# Patient Record
Sex: Male | Born: 1960 | Race: White | Hispanic: No | Marital: Single | Smoking: Former smoker
Health system: Southern US, Community
[De-identification: ages and names within clinical notes are randomized; demographics above are authoritative.]

---

## 2019-08-20 ENCOUNTER — Other Ambulatory Visit: Payer: Self-pay

## 2019-08-20 ENCOUNTER — Emergency Department (HOSPITAL_COMMUNITY): Payer: Self-pay

## 2019-08-20 ENCOUNTER — Emergency Department (HOSPITAL_COMMUNITY): Payer: 59

## 2019-08-20 ENCOUNTER — Encounter (HOSPITAL_COMMUNITY): Payer: Self-pay

## 2019-08-20 ENCOUNTER — Observation Stay (HOSPITAL_COMMUNITY)
Admission: EM | Admit: 2019-08-20 | Discharge: 2019-08-22 | Disposition: A | Discharge status: Home / Self Care | Payer: 59 | Attending: Internal Medicine | Admitting: Internal Medicine

## 2019-08-20 DIAGNOSIS — E119 Type 2 diabetes mellitus without complications: Secondary | ICD-10-CM | POA: Insufficient documentation

## 2019-08-20 DIAGNOSIS — Z20822 Contact with and (suspected) exposure to covid-19: Secondary | ICD-10-CM | POA: Insufficient documentation

## 2019-08-20 DIAGNOSIS — R609 Edema, unspecified: Secondary | ICD-10-CM

## 2019-08-20 DIAGNOSIS — L97521 Non-pressure chronic ulcer of other part of left foot limited to breakdown of skin: Secondary | ICD-10-CM | POA: Diagnosis present

## 2019-08-20 DIAGNOSIS — L03116 Cellulitis of left lower limb: Principal | ICD-10-CM | POA: Insufficient documentation

## 2019-08-20 DIAGNOSIS — S92912A Unspecified fracture of left toe(s), initial encounter for closed fracture: Secondary | ICD-10-CM | POA: Diagnosis present

## 2019-08-20 DIAGNOSIS — R600 Localized edema: Secondary | ICD-10-CM | POA: Diagnosis present

## 2019-08-20 DIAGNOSIS — E1100 Type 2 diabetes mellitus with hyperosmolarity without nonketotic hyperglycemic-hyperosmolar coma (NKHHC): Secondary | ICD-10-CM | POA: Diagnosis present

## 2019-08-20 DIAGNOSIS — L03115 Cellulitis of right lower limb: Secondary | ICD-10-CM

## 2019-08-20 LAB — CBC WITH DIFFERENTIAL/PLATELET
Abs Immature Granulocytes: 0.02 10*3/uL (ref 0.00–0.07)
Basophils Absolute: 0.1 10*3/uL (ref 0.0–0.1)
Basophils Relative: 2 %
Eosinophils Absolute: 0.2 10*3/uL (ref 0.0–0.5)
Eosinophils Relative: 3 %
HCT: 40.9 % (ref 39.0–52.0)
Hemoglobin: 13.1 g/dL (ref 13.0–17.0)
Immature Granulocytes: 0 %
Lymphocytes Relative: 20 %
Lymphs Abs: 1.2 10*3/uL (ref 0.7–4.0)
MCH: 26.4 pg (ref 26.0–34.0)
MCHC: 32 g/dL (ref 30.0–36.0)
MCV: 82.5 fL (ref 80.0–100.0)
Monocytes Absolute: 0.5 10*3/uL (ref 0.1–1.0)
Monocytes Relative: 9 %
Neutro Abs: 4 10*3/uL (ref 1.7–7.7)
Neutrophils Relative %: 66 %
Platelets: 255 10*3/uL (ref 150–400)
RBC: 4.96 MIL/uL (ref 4.22–5.81)
RDW: 14.1 % (ref 11.5–15.5)
WBC: 6 10*3/uL (ref 4.0–10.5)
nRBC: 0 % (ref 0.0–0.2)

## 2019-08-20 LAB — HIV ANTIBODY (ROUTINE TESTING W REFLEX): HIV Screen 4th Generation wRfx: NONREACTIVE

## 2019-08-20 LAB — BASIC METABOLIC PANEL
Anion gap: 10 (ref 5–15)
BUN: 20 mg/dL (ref 6–20)
CO2: 23 mmol/L (ref 22–32)
Calcium: 9.1 mg/dL (ref 8.9–10.3)
Chloride: 102 mmol/L (ref 98–111)
Creatinine, Ser: 0.57 mg/dL — ABNORMAL LOW (ref 0.61–1.24)
GFR calc Af Amer: >60 mL/min (ref >60)
GFR calc non Af Amer: >60 mL/min (ref >60)
Glucose, Bld: 227 mg/dL — ABNORMAL HIGH (ref 70–99)
Potassium: 4.3 mmol/L (ref 3.5–5.1)
Sodium: 135 mmol/L (ref 135–145)

## 2019-08-20 LAB — GLUCOSE, CAPILLARY: Glucose-Capillary: 156 mg/dL — ABNORMAL HIGH (ref 70–99)

## 2019-08-20 LAB — SARS CORONAVIRUS 2 BY RT PCR (HOSPITAL ORDER, PERFORMED IN ~~LOC~~ HOSPITAL LAB): SARS Coronavirus 2: NEGATIVE

## 2019-08-20 LAB — LACTIC ACID, PLASMA: Lactic Acid, Venous: 0.7 mmol/L (ref 0.5–1.9)

## 2019-08-20 MED ORDER — VANCOMYCIN HCL 1250 MG/250ML IV SOLN
1250.0000 mg | Freq: Two times a day (BID) | INTRAVENOUS | Status: DC
  Administered 2019-08-20 – 2019-08-22 (×4): 1250 mg via INTRAVENOUS
  Filled 2019-08-20 (×4): qty 250

## 2019-08-20 MED ORDER — TRAMADOL HCL 50 MG PO TABS: 50.0000 mg | ORAL_TABLET | Freq: Four times a day (QID) | ORAL | Status: DC | PRN

## 2019-08-20 MED ORDER — VANCOMYCIN HCL IN DEXTROSE 1-5 GM/200ML-% IV SOLN: 1000.0000 mg | Freq: Once | INTRAVENOUS | Status: DC

## 2019-08-20 MED ORDER — ONDANSETRON HCL 4 MG PO TABS: 4.0000 mg | ORAL_TABLET | Freq: Four times a day (QID) | ORAL | Status: DC | PRN

## 2019-08-20 MED ORDER — IOHEXOL 300 MG/ML  SOLN
100.0000 mL | Freq: Once | INTRAMUSCULAR | Status: AC | PRN
  Administered 2019-08-20: 100 mL via INTRAVENOUS

## 2019-08-20 MED ORDER — ACETAMINOPHEN 325 MG PO TABS: 650.0000 mg | ORAL_TABLET | Freq: Four times a day (QID) | ORAL | Status: DC | PRN

## 2019-08-20 MED ORDER — VANCOMYCIN HCL 2000 MG/400ML IV SOLN
2000.0000 mg | Freq: Once | INTRAVENOUS | Status: AC
  Administered 2019-08-20: 2000 mg via INTRAVENOUS
  Filled 2019-08-20: qty 400

## 2019-08-20 MED ORDER — SENNOSIDES-DOCUSATE SODIUM 8.6-50 MG PO TABS: 1.0000 | ORAL_TABLET | Freq: Every evening | ORAL | Status: DC | PRN

## 2019-08-20 MED ORDER — INSULIN ASPART 100 UNIT/ML ~~LOC~~ SOLN
0.0000 [IU] | Freq: Three times a day (TID) | SUBCUTANEOUS | Status: DC
  Administered 2019-08-20: 2 [IU] via SUBCUTANEOUS
  Administered 2019-08-21: 1 [IU] via SUBCUTANEOUS
  Administered 2019-08-21: 2 [IU] via SUBCUTANEOUS
  Administered 2019-08-22: 3 [IU] via SUBCUTANEOUS
  Administered 2019-08-22: 2 [IU] via SUBCUTANEOUS
  Filled 2019-08-20: qty 0.09

## 2019-08-20 MED ORDER — ENOXAPARIN SODIUM 40 MG/0.4ML ~~LOC~~ SOLN
40.0000 mg | SUBCUTANEOUS | Status: DC
  Administered 2019-08-20 – 2019-08-21 (×2): 40 mg via SUBCUTANEOUS
  Filled 2019-08-20 (×2): qty 0.4

## 2019-08-20 MED ORDER — ACETAMINOPHEN 650 MG RE SUPP: 650.0000 mg | Freq: Four times a day (QID) | RECTAL | Status: DC | PRN

## 2019-08-20 MED ORDER — ONDANSETRON HCL 4 MG/2ML IJ SOLN: 4.0000 mg | Freq: Four times a day (QID) | INTRAMUSCULAR | Status: DC | PRN

## 2019-08-20 MED ORDER — SODIUM CHLORIDE (PF) 0.9 % IJ SOLN
INTRAMUSCULAR | Status: AC
  Filled 2019-08-20: qty 50

## 2019-08-20 MED ORDER — PIPERACILLIN-TAZOBACTAM 3.375 G IVPB
3.3750 g | Freq: Three times a day (TID) | INTRAVENOUS | Status: DC
  Administered 2019-08-20 – 2019-08-22 (×6): 3.375 g via INTRAVENOUS
  Filled 2019-08-20 (×7): qty 50

## 2019-08-20 MED ORDER — PIPERACILLIN-TAZOBACTAM 3.375 G IVPB 30 MIN
3.3750 g | Freq: Once | INTRAVENOUS | Status: AC
  Administered 2019-08-20: 3.375 g via INTRAVENOUS
  Filled 2019-08-20: qty 50

## 2019-08-20 NOTE — H&P (Signed)
History and Physical    Bobby Fernandez IOE:[id number] DOB: [birth year] DOA: 08/20/2019  PCP: Patient, No Pcp Per   Patient coming from: Home  Chief Complaint  Patient presents with   Leg Swelling      HPI: Bobby Fernandez is a 59 y.o. male with medical history significant for diabetes not on medication comes to the ED for evaluation of left leg wound and swelling for a week.  Patient complains of left lower extremity swelling about a week ago with pain behind the left knee, he has only on the left fourth digit after sustaining a cut with some seizure.  He had cleaned with water and hydrogen peroxide.  Patient otherwise denies any nausea, vomiting, chest pain, shortness of breath, fever, chills, headache, focal weakness, numbness tingling, speech difficulties. Patient reports he used ot be on insulin pen last year but due to pandemic and long que in the pharmacy, he is not able to get medication from the state pharmacy, and has not been using anything.  ED Course: Vitals stable afebrile, 7 normal lactic acid normal WBC count.  Duplex was negative for DVT.  Given his significant left lower extremity patient was placed on vancomycin and Zosyn and I is being admitted for further management.  Review of Systems: All systems were reviewed and were negative except as mentioned in HPI above. Negative for fever Negative for chest pain Negative for shortness of breath  History reviewed. No pertinent past medical history.  History reviewed. No pertinent surgical history.   has no history on file for tobacco use, alcohol use, and drug use.  No Known Allergies  No family history on file.   Prior to Admission medications   Medication Sig Start Date End Date Taking? Authorizing Provider  naproxen sodium (ALEVE) 220 MG tablet Take 440 mg by mouth 2 (two) times daily as needed (pain).   Yes [provider]    Physical Exam: Vitals:   08/20/19 0900 08/20/19 0930 08/20/19 1000 08/20/19  1100  BP: (!) 140/81 (!) 141/74 (!) 139/75 (!) 139/83  Pulse: 85 85 86 89  Resp: 14 12 16 15   Temp:      TempSrc:      SpO2: 95% 98% 99% 95%  Weight:      Height:        General exam: AAOx3 , NAD, weak appearing. HEENT:Oral mucosa moist, Ear/Nose WNL grossly, dentition normal. Respiratory system: bilaterally clear,no wheezing or crackles,no use of accessory muscle Cardiovascular system: S1 & S2 +, No JVD,. Gastrointestinal system: Abdomen soft, NT,ND, BS+ Nervous System:Alert, awake, moving extremities and grossly nonfocal Extremities: No edema, distal peripheral pulses palpable.  Skin: No rashes,no icterus. MSK: Normal muscle bulk,tone, power      Labs on Admission: I have personally reviewed following labs and imaging studies  CBC: Recent Labs  Lab 08/20/19 0816  WBC 6.0  NEUTROABS 4.0  HGB 13.1  HCT 40.9  MCV 82.5  PLT 255   Basic Metabolic Panel: Recent Labs  Lab 08/20/19 0816  NA 135  K 4.3  CL 102  CO2 23  GLUCOSE 227*  BUN 20  CREATININE 0.57*  CALCIUM 9.1   GFR: Estimated Creatinine Clearance: 116.9 mL/min (A) (by C-G formula based on SCr of 0.57 mg/dL (L)). Liver Function Tests: No results for input(s): AST, ALT, ALKPHOS, BILITOT, PROT, ALBUMIN in the last 168 hours. No results for input(s): LIPASE, AMYLASE in the last 168 hours. No results for input(s): AMMONIA in the last 168 hours. Coagulation  Profile: No results for input(s): INR, PROTIME in the last 168 hours. Cardiac Enzymes: No results for input(s): CKTOTAL, CKMB, CKMBINDEX, TROPONINI in the last 168 hours. BNP (last 3 results) No results for input(s): PROBNP in the last 8760 hours. HbA1C: No results for input(s): HGBA1C in the last 72 hours. CBG: No results for input(s): GLUCAP in the last 168 hours. Lipid Profile: No results for input(s): CHOL, HDL, LDLCALC, TRIG, CHOLHDL, LDLDIRECT in the last 72 hours. Thyroid Function Tests: No results for input(s): TSH, T4TOTAL, FREET4,  T3FREE, THYROIDAB in the last 72 hours. Anemia Panel: No results for input(s): VITAMINB12, FOLATE, FERRITIN, TIBC, IRON, RETICCTPCT in the last 72 hours. Urine analysis: No results found for: COLORURINE, APPEARANCEUR, LABSPEC, PHURINE, GLUCOSEU, HGBUR, BILIRUBINUR, KETONESUR, PROTEINUR, UROBILINOGEN, NITRITE, LEUKOCYTESUR  Radiological Exams on Admission: DG Foot Complete Left  Result Date: 08/20/2019 CLINICAL DATA:  Acute left foot pain and swelling without known injury. EXAM: LEFT FOOT - COMPLETE 3+ VIEW COMPARISON:  None. FINDINGS: Minimally displaced fracture is seen involving the proximal base of the fifth proximal phalanx which may be acute or subacute. Joint spaces are intact. Mild posterior calcaneal spur for ring is noted. Dorsal soft tissue swelling is noted. IMPRESSION: Minimally displaced fifth proximal phalangeal fracture is noted which may be acute or subacute. Dorsal soft tissue swelling is noted. Electronically Signed   By: Lupita Raider M.D.   On: 08/20/2019 08:08   CT EXTREMITY LOWER LEFT W CONTRAST  Result Date: 08/20/2019 CLINICAL DATA:  Left lower leg pain and swelling for 1 week. Skin wound on the left third and fourth toes. EXAM: CT OF THE LOWER LEFT EXTREMITY WITH CONTRAST TECHNIQUE: Multidetector CT imaging of the lower left extremity was performed according to the standard protocol following intravenous contrast administration. COMPARISON:  Plain films of the left foot earlier today. CONTRAST:  100 mL OMNIPAQUE IOHEXOL 300 MG/ML  SOLN FINDINGS: Bones/Joint/Cartilage No acute or focal bony or joint abnormality is identified. There is some osteophytosis about the knee. Ligaments Suboptimally assessed by CT. Muscles and Tendons No intramuscular fluid collection is seen. No gas within muscle or tracking along fascial planes. There is mild fatty atrophy of all imaged muscle bellies. Soft tissues Reported skin wounds on the third and fourth toes are not visible on this study.  Diffuse subcutaneous edema is present. No soft tissue gas or radiopaque foreign body. No focal fluid collection. IMPRESSION: Diffuse subcutaneous edema consistent with dependent change or cellulitis. The exam is otherwise negative. Electronically Signed   By: Drusilla Kanner M.D.   On: 08/20/2019 10:56   VAS [place] LOWER EXTREMITY VENOUS (DVT) (ONLY [facility] & [facility])  Result Date: 08/20/2019  Lower Venous DVTStudy Indications: Edema.  Comparison Study: no prior Performing Technologist: Blanch Media RVS  Examination Guidelines: A complete evaluation includes B-mode imaging, spectral Doppler, color Doppler, and power Doppler as needed of all accessible portions of each vessel. Bilateral testing is considered an integral part of a complete examination. Limited examinations for reoccurring indications may be performed as noted. The reflux portion of the exam is performed with the patient in reverse Trendelenburg.  +-----+---------------+---------+-----------+----------+--------------+  RIGHT Compressibility Phasicity Spontaneity Properties Thrombus Aging  +-----+---------------+---------+-----------+----------+--------------+  CFV   Full            Yes       Yes                                    +-----+---------------+---------+-----------+----------+--------------+   +---------+---------------+---------+-----------+----------+--------------+  LEFT      Compressibility Phasicity Spontaneity Properties Thrombus Aging  +---------+---------------+---------+-----------+----------+--------------+  CFV       Full            Yes       Yes                                    +---------+---------------+---------+-----------+----------+--------------+  SFJ       Full                                                             +---------+---------------+---------+-----------+----------+--------------+  FV Prox   Full                                                              +---------+---------------+---------+-----------+----------+--------------+  FV Mid    Full                                                             +---------+---------------+---------+-----------+----------+--------------+  FV Distal Full                                                             +---------+---------------+---------+-----------+----------+--------------+  PFV       Full                                                             +---------+---------------+---------+-----------+----------+--------------+  POP       Full            Yes       Yes                                    +---------+---------------+---------+-----------+----------+--------------+  PTV       Full                                                             +---------+---------------+---------+-----------+----------+--------------+  PERO  Not visualized  +---------+---------------+---------+-----------+----------+--------------+     Summary: RIGHT: - No evidence of common femoral vein obstruction.  LEFT: - There is no evidence of deep vein thrombosis in the lower extremity.  - No cystic structure found in the popliteal fossa.  *See table(s) above for measurements and observations.    Preliminary      Assessment/Plan  Left leg cellulitis with wound on the left fourth digit.  Patient is admitted continue to bolus on antibiotics and hopefully can de-escalate antibiotics soon.  No DVT on the duplex.  CT lower extremity with contrast shows diffuse subcutaneous edema consistent with dependent changes or cellulitis but no other acute finding otherwise.  Continue pain control, elevate the leg, PT eval.  Diabetes mellitus not on medication apparently was supposed to be insulin but could not afford.  We will keep on sliding scale insulin check hemoglobin A1c.  Based on A1c can consider resuming insulin vs OHA  Obesity with BMI 29.9 he will benefit with weight  loss.  Body mass index is 29.99 kg/m.   Severity of Illness: * I certify that at the point of admission it is my clinical judgment that the patient will require inpatient hospital care spanning beyond 2 midnights from the point of admission due to high intensity of service, high risk for further deterioration and high frequency of surveillance required due to patient's LLE cellulitis, hyperglycemia.   DVT prophylaxis: enoxaparin (LOVENOX) injection 40 mg Start: 08/20/19 1145 Code Status:   Code Status: Full Code Family Communication: Admission, patients condition and plan of care including tests being ordered have been discussed with the patient who indicate understanding and agree with the plan and Code Status.  Consults called:   Lanae Boast MD [facility]  If 7PM-7AM, please contact night-coverage [email or web address]  08/20/2019, 11:36 AM

## 2019-08-20 NOTE — ED Provider Notes (Signed)
[facility]-EMERGENCY DEPT Provider Note   CSN: [id number] Arrival date & time: 08/20/19  0216     History Chief Complaint  Patient presents with  . Leg Swelling    Bobby OaksRobert Fernandez is a 59 y.o. male.  HPI   59 year old male presenting for evaluation of left lower extremity swelling that started about a week ago.  He also complains of pain behind the left knee.  He denies any chest pain or shortness of breath.  He does endorse having a wound to the left fourth digit.  States that he had some swelling to the toe about a week ago so he cut it with some scissors.  Following that he cleansed it with some water and hydrogen peroxide.  He denies having any fevers.  Denies hemoptysis, recent surgery, recent long travel, hormone use, personal hx of cancer, or hx of DVT/PE.    History reviewed. No pertinent past medical history.  Patient Active Problem List   Diagnosis Date Noted  . Left leg cellulitis 08/20/2019    History reviewed. No pertinent surgical history.     No family history on file.  Social History   Tobacco Use  . Smoking status: Not on file  Substance Use Topics  . Alcohol use: Not on file  . Drug use: Not on file    Home Medications Prior to Admission medications   Medication Sig Start Date End Date Taking? Authorizing Provider  naproxen sodium (ALEVE) 220 MG tablet Take 440 mg by mouth 2 (two) times daily as needed (pain).   Yes [provider]    Allergies    Patient has no known allergies.  Review of Systems   Review of Systems  Constitutional: Negative for fever.  HENT: Negative for ear pain and sore throat.   Eyes: Negative for visual disturbance.  Respiratory: Negative for cough and shortness of breath.   Cardiovascular: Positive for leg swelling. Negative for chest pain.  Gastrointestinal: Negative for abdominal pain, constipation, diarrhea, nausea and vomiting.  Genitourinary: Negative for dysuria and hematuria.    Musculoskeletal:       Left leg pain  Skin: Positive for wound.  Neurological: Negative for headaches.  All other systems reviewed and are negative.   Physical Exam Updated Vital Signs BP (!) 139/83   Pulse 89   Temp 97.6 F (36.4 C) (Oral)   Resp 15   Ht 5\' 11"  (1.803 m)   Wt (!) 97.5 kg   SpO2 95%   BMI 29.99 kg/m   Physical Exam Vitals and nursing note reviewed.  Constitutional:      Appearance: He is well-developed.  HENT:     Head: Normocephalic and atraumatic.  Eyes:     Conjunctiva/sclera: Conjunctivae normal.  Cardiovascular:     Rate and Rhythm: Normal rate and regular rhythm.     Pulses: Normal pulses.     Heart sounds: Normal heart sounds. No murmur heard.   Pulmonary:     Effort: Pulmonary effort is normal. No respiratory distress.     Breath sounds: Normal breath sounds. No wheezing, rhonchi or rales.  Abdominal:     General: Bowel sounds are normal.     Palpations: Abdomen is soft.     Tenderness: There is no abdominal tenderness. There is no guarding or rebound.  Musculoskeletal:     Cervical back: Neck supple.     Comments: 3+ pitting edema to the LLE with TTP to the posterior knee. Left 4th toe is open  wound/ulceration w/u active drainage from the wound. Erythema and warmth noted to the foot and extending up to the thigh area.  Skin:    General: Skin is warm and dry.  Neurological:     Mental Status: He is alert.       ED Results / Procedures / Treatments   Labs (all labs ordered are listed, but only abnormal results are displayed) Labs Reviewed  BASIC METABOLIC PANEL - Abnormal; Notable for the following components:      Result Value   Glucose, Bld 227 (*)    Creatinine, Ser 0.57 (*)    All other components within normal limits  CULTURE, BLOOD (ROUTINE X 2)  CULTURE, BLOOD (ROUTINE X 2)  SARS CORONAVIRUS 2 BY RT PCR (HOSPITAL ORDER, PERFORMED IN [facility])  CBC WITH DIFFERENTIAL/PLATELET  LACTIC ACID, PLASMA  LACTIC  ACID, PLASMA    EKG None  Radiology DG Foot Complete Left  Result Date: 08/20/2019 CLINICAL DATA:  Acute left foot pain and swelling without known injury. EXAM: LEFT FOOT - COMPLETE 3+ VIEW COMPARISON:  None. FINDINGS: Minimally displaced fracture is seen involving the proximal base of the fifth proximal phalanx which may be acute or subacute. Joint spaces are intact. Mild posterior calcaneal spur for ring is noted. Dorsal soft tissue swelling is noted. IMPRESSION: Minimally displaced fifth proximal phalangeal fracture is noted which may be acute or subacute. Dorsal soft tissue swelling is noted. Electronically Signed   By: Lupita Raider M.D.   On: 08/20/2019 08:08   CT EXTREMITY LOWER LEFT W CONTRAST  Result Date: 08/20/2019 CLINICAL DATA:  Left lower leg pain and swelling for 1 week. Skin wound on the left third and fourth toes. EXAM: CT OF THE LOWER LEFT EXTREMITY WITH CONTRAST TECHNIQUE: Multidetector CT imaging of the lower left extremity was performed according to the standard protocol following intravenous contrast administration. COMPARISON:  Plain films of the left foot earlier today. CONTRAST:  100 mL OMNIPAQUE IOHEXOL 300 MG/ML  SOLN FINDINGS: Bones/Joint/Cartilage No acute or focal bony or joint abnormality is identified. There is some osteophytosis about the knee. Ligaments Suboptimally assessed by CT. Muscles and Tendons No intramuscular fluid collection is seen. No gas within muscle or tracking along fascial planes. There is mild fatty atrophy of all imaged muscle bellies. Soft tissues Reported skin wounds on the third and fourth toes are not visible on this study. Diffuse subcutaneous edema is present. No soft tissue gas or radiopaque foreign body. No focal fluid collection. IMPRESSION: Diffuse subcutaneous edema consistent with dependent change or cellulitis. The exam is otherwise negative. Electronically Signed   By: Drusilla Kanner M.D.   On: 08/20/2019 10:56   VAS [place] LOWER  EXTREMITY VENOUS (DVT) (ONLY [facility] & [facility])  Result Date: 08/20/2019  Lower Venous DVTStudy Indications: Edema.  Comparison Study: no prior Performing Technologist: Blanch Media RVS  Examination Guidelines: A complete evaluation includes B-mode imaging, spectral Doppler, color Doppler, and power Doppler as needed of all accessible portions of each vessel. Bilateral testing is considered an integral part of a complete examination. Limited examinations for reoccurring indications may be performed as noted. The reflux portion of the exam is performed with the patient in reverse Trendelenburg.  +-----+---------------+---------+-----------+----------+--------------+ RIGHTCompressibilityPhasicitySpontaneityPropertiesThrombus Aging +-----+---------------+---------+-----------+----------+--------------+ CFV  Full           Yes      Yes                                 +-----+---------------+---------+-----------+----------+--------------+   +---------+---------------+---------+-----------+----------+--------------+  LEFT     CompressibilityPhasicitySpontaneityPropertiesThrombus Aging +---------+---------------+---------+-----------+----------+--------------+ CFV      Full           Yes      Yes                                 +---------+---------------+---------+-----------+----------+--------------+ SFJ      Full                                                        +---------+---------------+---------+-----------+----------+--------------+ FV Prox  Full                                                        +---------+---------------+---------+-----------+----------+--------------+ FV Mid   Full                                                        +---------+---------------+---------+-----------+----------+--------------+ FV DistalFull                                                        +---------+---------------+---------+-----------+----------+--------------+ PFV       Full                                                        +---------+---------------+---------+-----------+----------+--------------+ POP      Full           Yes      Yes                                 +---------+---------------+---------+-----------+----------+--------------+ PTV      Full                                                        +---------+---------------+---------+-----------+----------+--------------+ PERO                                                  Not visualized +---------+---------------+---------+-----------+----------+--------------+     Summary: RIGHT: - No evidence of common femoral vein obstruction.  LEFT: - There is no evidence of deep vein thrombosis in the lower extremity.  - No cystic structure found in the popliteal fossa.  *See table(s) above for measurements and observations.    Preliminary     Procedures  Procedures (including critical care time)  Medications Ordered in ED Medications  vancomycin (VANCOREADY) IVPB 2000 mg/400 mL (2,000 mg Intravenous New Bag/Given 08/20/19 1111)  piperacillin-tazobactam (ZOSYN) IVPB 3.375 g ( Intravenous Stopped 08/20/19 1108)  sodium chloride (PF) 0.9 % injection (  Given by Other 08/20/19 1041)  iohexol (OMNIPAQUE) 300 MG/ML solution 100 mL (100 mLs Intravenous Contrast Given 08/20/19 1026)    ED Course  I have reviewed the triage vital signs and the nursing notes.  Pertinent labs & imaging results that were available during my care of the patient were reviewed by me and considered in my medical decision making (see chart for details).    MDM Rules/Calculators/A&P                          59 year old male presenting for evaluation of left lower extremity swelling.  Also has wound to right fourth toe  CBC without evidence of leukocytosis or anemia BMP with elevated blood glucose but otherwise reassuring Lactic acid negative Blood cultures obtained  X-ray of the left foot does not show  any evidence of osteomyelitis but does show likely subacute fracture of the left fifth proximal phalanges  Right lower extremity ultrasound w/o evidence of DVT  CT RLE with diffuse subcutaneous edema consistent with dependent change or cellulitis. The exam is otherwise negative  Pt with rapidly progressing RLE cellulitis. No obvious osteomyelitis on imaging thus far. Will admit for further tx.   11:31 AM CONSULT with Dr. Lanae Boast who accepts patient for admission.  Final Clinical Impression(s) / ED Diagnoses Final diagnoses:  Cellulitis of right lower extremity    Rx / DC Orders ED Discharge Orders    None       Karrie Meres, PA-C 08/20/19 1137    Sabas Sous, MD 08/20/19 1547

## 2019-08-20 NOTE — Progress Notes (Signed)
A consult was received from an ED physician for Vancomycin per pharmacy dosing.  The patient's profile has been reviewed for ht/wt/allergies/indication/available labs.    A one time order has been placed for Vancomycin 2gm, discontinued the 1gm ordered by ED extender.  Further antibiotics/pharmacy consults should be ordered by admitting physician if indicated.                       Thank you,  Otho Bellows PharmD 08/20/2019  9:33 AM

## 2019-08-20 NOTE — Progress Notes (Signed)
Lower extremity venous has been completed.   Preliminary results in CV Proc.   Blanch Media 08/20/2019 8:37 AM

## 2019-08-20 NOTE — ED Triage Notes (Signed)
Pt sts left leg swelling x 1 week. Denies any  medical history.

## 2019-08-20 NOTE — Progress Notes (Signed)
Pharmacy Antibiotic Note  Bobby Fernandez is a 59 y.o. male admitted on 08/20/2019 with cellulitis.  Pharmacy has been consulted for Vancomycin & Zosyn dosing.  Plan: Zosyn 3.375g IV q8h (4 hour infusion).  Vancomycin 2gm x1, then 1250mg  q12  Height: 5\' 11"  (180.3 cm) Weight: (!) 97.5 kg (215 lb) IBW/kg (Calculated) : 75.3  Temp (24hrs), Avg:97.8 F (36.6 C), Min:97.6 F (36.4 C), Max:97.9 F (36.6 C)  Recent Labs  Lab 08/20/19 0816 08/20/19 0929  WBC 6.0  --   CREATININE 0.57*  --   LATICACIDVEN  --  0.7    Estimated Creatinine Clearance: 116.9 mL/min (A) (by C-G formula based on SCr of 0.57 mg/dL (L)).    No Known Allergies  Antimicrobials this admission: 7/30 Zosyn >>  7/30 Vancomycin >>   Dose adjustments this admission:  Microbiology results: 7/30 BCx: sent  Thank you for allowing pharmacy to be a part of this patient's care.  8/30 PharmD 08/20/2019 11:53 AM

## 2019-08-21 DIAGNOSIS — L03116 Cellulitis of left lower limb: Secondary | ICD-10-CM

## 2019-08-21 DIAGNOSIS — E1165 Type 2 diabetes mellitus with hyperglycemia: Secondary | ICD-10-CM | POA: Diagnosis present

## 2019-08-21 DIAGNOSIS — L97521 Non-pressure chronic ulcer of other part of left foot limited to breakdown of skin: Secondary | ICD-10-CM | POA: Diagnosis present

## 2019-08-21 DIAGNOSIS — E119 Type 2 diabetes mellitus without complications: Secondary | ICD-10-CM | POA: Diagnosis present

## 2019-08-21 DIAGNOSIS — E1100 Type 2 diabetes mellitus with hyperosmolarity without nonketotic hyperglycemic-hyperosmolar coma (NKHHC): Secondary | ICD-10-CM | POA: Diagnosis present

## 2019-08-21 DIAGNOSIS — S92912A Unspecified fracture of left toe(s), initial encounter for closed fracture: Secondary | ICD-10-CM | POA: Diagnosis present

## 2019-08-21 LAB — COMPREHENSIVE METABOLIC PANEL
ALT: 14 U/L (ref 0–44)
AST: 13 U/L — ABNORMAL LOW (ref 15–41)
Albumin: 3.4 g/dL — ABNORMAL LOW (ref 3.5–5.0)
Alkaline Phosphatase: 96 U/L (ref 38–126)
Anion gap: 8 (ref 5–15)
BUN: 13 mg/dL (ref 6–20)
CO2: 22 mmol/L (ref 22–32)
Calcium: 8.5 mg/dL — ABNORMAL LOW (ref 8.9–10.3)
Chloride: 106 mmol/L (ref 98–111)
Creatinine, Ser: 0.48 mg/dL — ABNORMAL LOW (ref 0.61–1.24)
GFR calc Af Amer: >60 mL/min (ref >60)
GFR calc non Af Amer: >60 mL/min (ref >60)
Glucose, Bld: 152 mg/dL — ABNORMAL HIGH (ref 70–99)
Potassium: 4 mmol/L (ref 3.5–5.1)
Sodium: 136 mmol/L (ref 135–145)
Total Bilirubin: 1 mg/dL (ref 0.3–1.2)
Total Protein: 6.3 g/dL — ABNORMAL LOW (ref 6.5–8.1)

## 2019-08-21 LAB — GLUCOSE, CAPILLARY
Glucose-Capillary: 194 mg/dL — ABNORMAL HIGH (ref 70–99)
Glucose-Capillary: 145 mg/dL — ABNORMAL HIGH (ref 70–99)
Glucose-Capillary: 184 mg/dL — ABNORMAL HIGH (ref 70–99)

## 2019-08-21 LAB — CBC
HCT: 35.5 % — ABNORMAL LOW (ref 39.0–52.0)
Hemoglobin: 11.5 g/dL — ABNORMAL LOW (ref 13.0–17.0)
MCH: 26.7 pg (ref 26.0–34.0)
MCHC: 32.4 g/dL (ref 30.0–36.0)
MCV: 82.6 fL (ref 80.0–100.0)
Platelets: 223 10*3/uL (ref 150–400)
RBC: 4.3 MIL/uL (ref 4.22–5.81)
RDW: 14 % (ref 11.5–15.5)
WBC: 4.3 10*3/uL (ref 4.0–10.5)
nRBC: 0 % (ref 0.0–0.2)

## 2019-08-21 LAB — HEMOGLOBIN A1C
Hgb A1c MFr Bld: 9.9 % — ABNORMAL HIGH (ref 4.8–5.6)
Mean Plasma Glucose: 237.43 mg/dL

## 2019-08-21 MED ORDER — MUPIROCIN 2 % EX OINT
TOPICAL_OINTMENT | Freq: Every day | CUTANEOUS | Status: DC
  Filled 2019-08-21: qty 22

## 2019-08-21 NOTE — Progress Notes (Signed)
Physical Therapy Treatment Patient Details Name: Bobby Fernandez MRN: [id number] DOB: 13-Jul-1960 Today's Date: 08/21/2019    History of Present Illness 59 year old male presenting for evaluation of left lower extremity swelling ,pain behind the left kneehas  a wound to the left fourth digit.    PT Comments    Patient instructed in use 56fcane for ambulation of which is beneficial for balance and gaitto offset some weight bearing  From left foot. Patient has post op shoe but not wearing it at present. Patient is ambulatory in room. No further PT needs at this time. PT will sign off.   RECOMMEND SINGLE POINT CANE   PRIOR TO DC.  Follow Up Recommendations  No PT follow up     Equipment Recommendations  Cane    Recommendations for Other Services       Precautions / Restrictions Precautions Precautions: None Precaution Comments: foot ulcer- has a post op shoe but not wearing it Required Braces or Orthoses: Other Brace Other Brace: post op shoe Restrictions Weight Bearing Restrictions: No    Mobility  Bed Mobility Overal bed mobility: In recliner                Transfers Overall transfer level: Independent                  Ambulation/Gait Ambulation/Gait assistance: Modified independent (Device/Increase time) Gait Distance (Feet): 200 Feet Assistive device: Straight cane Gait Pattern/deviations: Step-through pattern   Gait velocity interpretation: <1.31 ft/sec, indicative of household ambulator General Gait Details: ambulated with SPC. gait steady   Stairs             Wheelchair Mobility    Modified Rankin (Stroke Patients Only)       Balance Overall balance assessment: No apparent balance deficits (not formally assessed)                                          Cognition                                              Exercises      General Comments        Pertinent Vitals/Pain Pain Location:  when weight bearing left Pain Descriptors / Indicators: Discomfort;Grimacing;Pressure Pain Intervention(s): Monitored during session    Home Living                      Prior Function            PT Goals (current goals can now be found in the care plan section) Progress towards PT goals: Goals met/education completed, patient discharged from PT    Frequency           PT Plan Current plan remains appropriate    Co-evaluation              AM-PAC PT "6 Clicks" Mobility   Outcome Measure  Help needed turning from your back to your side while in a flat bed without using bedrails?: None Help needed moving from lying on your back to sitting on the side of a flat bed without using bedrails?: None Help needed moving to and from a bed to a chair (including a wheelchair)?: None Help needed standing up from  a chair using your arms (e.g., wheelchair or bedside chair)?: None Help needed to walk in hospital room?: None Help needed climbing 3-5 steps with a railing? : A Little 6 Click Score: 23    End of Session   Activity Tolerance: Patient tolerated treatment well Patient left: in chair;with call bell/phone within reach Nurse Communication: Mobility status PT Visit Diagnosis: Unsteadiness on feet (R26.81);Pain Pain - Right/Left: Left Pain - part of body: Ankle and joints of foot     Time: 6701-4103 PT Time Calculation (min) (ACUTE ONLY): 11 min  Charges:  $Gait Training: 8-22 mins                     [place] Pager [phone] Office [phone]    Claretha Cooper 08/21/2019, 3:23 PM

## 2019-08-21 NOTE — Progress Notes (Signed)
Orthopedic Tech Progress Note Patient Details:  Bobby Fernandez 03/21/1960 [id number]  Ortho Devices Type of Ortho Device: Postop shoe/boot Ortho Device/Splint Location: LLE Ortho Device/Splint Interventions: Ordered, Application   Post Interventions Patient Tolerated: Well Instructions Provided: Care of device   Jennye Moccasin 08/21/2019, 12:23 PM

## 2019-08-21 NOTE — Discharge Instructions (Signed)
You may put all of your weight on your left foot as comfort allows in the postop shoe. You may soak your left foot once daily in room temperature water combined with antibacterial soap.  Make the water soapy.  Do this for 15 minutes. After you soak your foot, dried off for a well and place a small amount of Bactroban ointment on the fourth toe wound once a day followed by a Band-Aid.

## 2019-08-21 NOTE — Progress Notes (Signed)
PROGRESS NOTE  Bobby Fernandez ATF:[id number] DOB: July 20, 1960 DOA: 08/20/2019 PCP: Patient, No Pcp Per  Brief History   Bobby Fernandez is a 59 y.o. male with medical history significant for diabetes not on medication comes to the ED for evaluation of left leg wound and swelling for a week.  Patient complains of left lower extremity swelling about a week ago with pain behind the left knee, he has only on the left fourth digit after sustaining a cut with some seizure.  He had cleaned with water and hydrogen peroxide.  Patient otherwise denies any nausea, vomiting, chest pain, shortness of breath, fever, chills, headache, focal weakness, numbness tingling, speech difficulties. Patient reports he used ot be on insulin pen last year but due to pandemic and long que in the pharmacy, he is not able to get medication from the state pharmacy, and has not been using anything.  The patient has been admitted to a medical bed. He has been started on Zosyn and vancomycin for the ulcerations on the 4th and 5th digits of the left foot and cellulitis of the left lower extremity. He states that a lot of the swelling in the extremity has gone away. X-ray of the left foot demonstrated fracture of the proximal phalanx of the 5th digit of the right foot. They have recommended a post-op bot for the left lower extremity. They have also recommended washing the foot daily in antibacterial soapy water for 15 minutes, drying it and them applying bactroban ointment over the 4th toe daily. They will follow him up as outpatient in 1-2 weeks. Blood cultures x 2 have been obtained and have had no growth.  The patient has been evaluated by PT. They have recommended a single point cane prior to discharge. DVT in the left lower extremity has been ruled out by doppler.  Consultants  . Orthopedic surgery.  Procedures  . None  Antibiotics   Anti-infectives (From admission, onward)   Start     Dose/Rate Route Frequency Ordered Stop    08/20/19 2300  vancomycin (VANCOREADY) IVPB 1250 mg/250 mL     Discontinue     1,250 mg 166.7 mL/hr over 90 Minutes Intravenous Every 12 hours 08/20/19 1303     08/20/19 1800  piperacillin-tazobactam (ZOSYN) IVPB 3.375 g     Discontinue     3.375 g 12.5 mL/hr over 240 Minutes Intravenous Every 8 hours 08/20/19 1303     08/20/19 1000  vancomycin (VANCOREADY) IVPB 2000 mg/400 mL        2,000 mg 200 mL/hr over 120 Minutes Intravenous  Once 08/20/19 0932 08/20/19 1329   08/20/19 0930  vancomycin (VANCOCIN) IVPB 1000 mg/200 mL premix  Status:  Discontinued        1,000 mg 200 mL/hr over 60 Minutes Intravenous  Once 08/20/19 0929 08/20/19 0932   08/20/19 0930  piperacillin-tazobactam (ZOSYN) IVPB 3.375 g        3.375 g 100 mL/hr over 30 Minutes Intravenous  Once 08/20/19 0929 08/20/19 1108    .   Subjective  The patient is resting comfortably. No new complaints.  Objective   Vitals:  Vitals:   08/21/19 0621 08/21/19 1317  BP: (!) 148/84 (!) 138/80  Pulse: 83 85  Resp: 17 16  Temp: 97.7 F (36.5 C) 98.1 F (36.7 C)  SpO2: 96% 97%   Exam:  Constitutional:  . The patient is awake, alert, and oriented x 3. No acute distress. Respiratory:  . No increased work of breathing. Marland Kitchen No  wheezes, rales, or rhonchi . No tactile fremitus Cardiovascular:  . Regular rate and rhythm . No murmurs, ectopy, or gallups. . No lateral PMI. No thrills. Abdomen:  . Abdomen is soft, non-tender, non-distended . No hernias, masses, or organomegaly . Normoactive bowel sounds.  Musculoskeletal:  . No cyanosis, clubbing, or edema . Left lower extremity is swollen.  . Left foot is erythematous and swollen. The 4th digit is ulcerated, and the fifth digit is disfigured. Skin:  . No rashes or lesions . There are ulcers to the 4th and 5th digits of the left foot. . There is also erythema and swelling to the left lower extremity and foot. . palpation of skin: no induration or nodules Neurologic:  . CN  2-12 intact . Sensation all 4 extremities intact Psychiatric:  . Mental status o Mood, affect appropriate o Orientation to person, place, time  . judgment and insight appear intact  I have personally reviewed the following:   Today's Data  . Vitals, CMP, CBC  Micro Data  . Blood cultures x 2 have had no growth.  Imaging  . X-ray of left foot.  Scheduled Meds: . enoxaparin (LOVENOX) injection  40 mg Subcutaneous Q24H  . insulin aspart  0-9 Units Subcutaneous TID WC  . mupirocin ointment   Topical Daily   Continuous Infusions: . piperacillin-tazobactam (ZOSYN)  IV 3.375 g (08/21/19 1053)  . 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 This is now complicated by cellulitis. He is receiving IV Vancomycin and cefepime. Blood cultures x 2 have been obtained and have had no growth. DVT in the extremity has been ruled out by doppler.  Ulceration of 4th digit of the right foot: The patient has been instructed not to "bust" blisters. Orthopedic surgery has recommended that the patient soak the foot in room temp bacterial soapy water for 15 minutes, dry the foot and then apply a small amount of bactroban ointment over the top of the 4rth digit daily. Dr. Magnus Ivan will see the patient in follow up in 1-2 weeks.  Fracture of base of proximal phalanx of 5th digit: I greatly appreciate the assistance of Dr. Magnus Ivan. He has arranged for the patient to get a post-op boot and will follow the patient up as outpatient in 1-2 weeks.  DM II: Uncontrolled. HbA1c is 9.9. The patient states that he has not had insulin in about a year and half  due to lack of insurance. I will consult SW to see if we can help him with this prior to discharge.  It is critical to the prevention of further infection and possible loss of his feet.  I have seen and examined this patient myself. I have spent 34 minutes in his evaluation and care.  DVT prophylaxis: Lovenox Code Status: Full Code Family Communication: None available Disposition Plan:   Status is: Inpatient  Remains inpatient appropriate because:Inpatient level of care appropriate due to severity of illness   Dispo: The patient is from: Home              Anticipated d/c is to: Home  Anticipated d/c date is: 1 day              Patient currently is not medically stable to d/c.  Bebe Moncure, DO [facility] Direct contact: see [email or web address]  7PM-7AM contact night coverage as above 08/21/2019, 6:52

## 2019-08-21 NOTE — Consult Note (Signed)
Reason for Consult:  Left foot 4th toe wound and fifth toe proximal phalanx fracture Referring Physician:  [facility]  Bobby Fernandez is an 59 y.o. male.  HPI:   The patient is a poorly controlled diabetic 59 year old gentleman who was admitted to the hospitalist service yesterday in the emergency room after presenting to the emergency room with left leg swelling and cellulitis.  He is reports that he had developed a laceration over the dorsal aspect of his left fourth toe recently and was treating it with peroxide.  X-rays yesterday of the left foot show a nondisplaced fracture of the fifth toe proximal phalanx at the base.  I reviewed these x-rays.  There is no evidence of bony destruction consistent with osteomyelitis and it actually appears subacute.  Orthopedic surgery was consulted for recommendations as to treatment for his foot.  He is on vancomycin IV for his cellulitis.  His hemoglobin A1c was well above 9 yesterday.  He does report some numbness in his feet and has never had any foot surgery before or significant wounds on his feet.  History reviewed. No pertinent past medical history.  History reviewed. No pertinent surgical history.  No family history on file.  Social History:  reports that he has quit smoking. He has never used smokeless tobacco. No history on file for alcohol use and drug use.  Allergies: No Known Allergies  Medications: I have reviewed the patient's current medications.  Results for orders placed or performed during the hospital encounter of 08/20/19 (from the past 48 hour(s))  CBC with Differential     Status: None   Collection Time: 08/20/19  8:16 AM  Result Value Ref Range   WBC 6.0 4.0 - 10.5 K/uL   RBC 4.96 4.22 - 5.81 MIL/uL   Hemoglobin 13.1 13.0 - 17.0 g/dL   HCT 41.7 39 - 52 %   MCV 82.5 80.0 - 100.0 fL   MCH 26.4 26.0 - 34.0 pg   MCHC 32.0 30.0 - 36.0 g/dL   RDW 40.8 14.4 - 81.8 %   Platelets 255 150 - 400 K/uL   nRBC 0.0 0.0 - 0.2 %     Neutrophils Relative % 66 %   Neutro Abs 4.0 1.7 - 7.7 K/uL   Lymphocytes Relative 20 %   Lymphs Abs 1.2 0.7 - 4.0 K/uL   Monocytes Relative 9 %   Monocytes Absolute 0.5 0 - 1 K/uL   Eosinophils Relative 3 %   Eosinophils Absolute 0.2 0 - 0 K/uL   Basophils Relative 2 %   Basophils Absolute 0.1 0 - 0 K/uL   Immature Granulocytes 0 %   Abs Immature Granulocytes 0.02 0.00 - 0.07 K/uL    Comment: Performed at [facility], [address] [place], [place] [address]  Basic metabolic panel     Status: Abnormal   Collection Time: 08/20/19  8:16 AM  Result Value Ref Range   Sodium 135 135 - 145 mmol/L   Potassium 4.3 3.5 - 5.1 mmol/L   Chloride 102 98 - 111 mmol/L   CO2 23 22 - 32 mmol/L   Glucose, Bld 227 (H) 70 - 99 mg/dL    Comment: Glucose reference range applies only to samples taken after fasting for at least 8 hours.   BUN 20 6 - 20 mg/dL   Creatinine, Ser 9.70 (L) 0.61 - 1.24 mg/dL   Calcium 9.1 8.9 - 26.3 mg/dL   GFR calc non Af Amer >60 >60 mL/min  GFR calc Af Amer >60 >60 mL/min   Anion gap 10 5 - 15    Comment: Performed at [facility], [address], [place], [place] [address]  Blood culture (routine x 2)     Status: None (Preliminary result)   Collection Time: 08/20/19  9:28 AM   Specimen: BLOOD  Result Value Ref Range   Specimen Description      BLOOD LEFT WRIST Performed at [facility], [address], [place], [place] [address]    Special Requests      BOTTLES DRAWN AEROBIC AND ANAEROBIC Blood Culture adequate volume Performed at [facility], [address] [place], [place] [address]    Culture      NO GROWTH < 24 HOURS Performed at [facility], [address], [place], [place] [address]    Report Status PENDING   Lactic acid, plasma     Status: None   Collection Time: 08/20/19  9:29 AM  Result Value Ref Range   Lactic Acid, Venous 0.7 0.5 - 1.9 mmol/L    Comment:  Performed at [facility], [address] [place], [place] [address]  Blood culture (routine x 2)     Status: None (Preliminary result)   Collection Time: 08/20/19 10:25 AM   Specimen: BLOOD LEFT HAND  Result Value Ref Range   Specimen Description      BLOOD LEFT HAND Performed at [facility], [address], [place], [place] [address]    Special Requests      BOTTLES DRAWN AEROBIC AND ANAEROBIC Blood Culture results may not be optimal due to an excessive volume of blood received in culture bottles Performed at [facility], [address] [place], [place] [address]    Culture      NO GROWTH < 24 HOURS Performed at [facility], [address] [place], [place] [address]    Report Status PENDING   SARS Coronavirus 2 by RT PCR (hospital order, performed in [facility] hospital lab) Nasopharyngeal Nasopharyngeal Swab     Status: None   Collection Time: 08/20/19 12:30 PM   Specimen: Nasopharyngeal Swab  Result Value Ref Range   SARS Coronavirus 2 NEGATIVE NEGATIVE    Comment: (NOTE) SARS-CoV-2 target nucleic acids are NOT DETECTED.  The SARS-CoV-2 RNA is generally detectable in upper and lower respiratory specimens during the acute phase of infection. The lowest concentration of SARS-CoV-2 viral copies this assay can detect is 250 copies / mL. A negative result does not preclude SARS-CoV-2 infection and should not be used as the sole basis for treatment or other patient management decisions.  A negative result may occur with improper specimen collection / handling, submission of specimen other than nasopharyngeal swab, presence of viral mutation(s) within the areas targeted by this assay, and inadequate number of viral copies (<250 copies / mL). A negative result must be combined with clinical observations, patient history, and epidemiological information.  Fact Sheet for Patients:     BoilerBrush.com.cy  Fact Sheet for Healthcare Providers: https://pope.com/  This test is not yet approved or  cleared by the [place] FDA and has been authorized for detection and/or diagnosis of SARS-CoV-2 by FDA under an Emergency Use Authorization (EUA).  This EUA will remain in effect (meaning this test can be used) for the duration of the COVID-19 declaration under Section 564(b)(1) of the Act, 21 U.S.C. section 360bbb-3(b)(1), unless the authorization is terminated  or revoked sooner.  Performed at [facility], [address] [place], [place] [address]   HIV Antibody (routine testing w rflx)     Status: None   Collection Time: 08/20/19  2:58 PM  Result Value Ref Range   HIV Screen 4th Generation wRfx Non Reactive Non Reactive    Comment: Performed at [facility], [address] [place], [place] [address]  Hemoglobin A1c     Status: Abnormal   Collection Time: 08/20/19  2:58 PM  Result Value Ref Range   Hgb A1c MFr Bld 9.9 (H) 4.8 - 5.6 %    Comment: (NOTE) Pre diabetes:          5.7%-6.4%  Diabetes:              >6.4%  Glycemic control for   <7.0% adults with diabetes    Mean Plasma Glucose 237.43 mg/dL    Comment: Performed at [facility], [address] [place], [place] [address]  Glucose, capillary     Status: Abnormal   Collection Time: 08/20/19  5:46 PM  Result Value Ref Range   Glucose-Capillary 156 (H) 70 - 99 mg/dL    Comment: Glucose reference range applies only to samples taken after fasting for at least 8 hours.   Comment 1 Notify RN    Comment 2 Document in Chart   Comprehensive metabolic panel     Status: Abnormal   Collection Time: 08/21/19  6:18 AM  Result Value Ref Range   Sodium 136 135 - 145 mmol/L   Potassium 4.0 3.5 - 5.1 mmol/L   Chloride 106 98 - 111 mmol/L   CO2 22 22 - 32 mmol/L   Glucose, Bld 152 (H) 70 - 99 mg/dL    Comment: Glucose reference  range applies only to samples taken after fasting for at least 8 hours.   BUN 13 6 - 20 mg/dL   Creatinine, Ser 8.410.48 (L) 0.61 - 1.24 mg/dL   Calcium 8.5 (L) 8.9 - 10.3 mg/dL   Total Protein 6.3 (L) 6.5 - 8.1 g/dL   Albumin 3.4 (L) 3.5 - 5.0 g/dL   AST 13 (L) 15 - 41 U/L   ALT 14 0 - 44 U/L   Alkaline Phosphatase 96 38 - 126 U/L   Total Bilirubin 1.0 0.3 - 1.2 mg/dL   GFR calc non Af Amer >60 >60 mL/min   GFR calc Af Amer >60 >60 mL/min   Anion gap 8 5 - 15    Comment: Performed at [facility], [address] [place], [place] [address]  CBC     Status: Abnormal   Collection Time: 08/21/19  6:18 AM  Result Value Ref Range   WBC 4.3 4.0 - 10.5 K/uL   RBC 4.30 4.22 - 5.81 MIL/uL   Hemoglobin 11.5 (L) 13.0 - 17.0 g/dL   HCT 10.235.5 (L) 39 - 52 %   MCV 82.6 80.0 - 100.0 fL   MCH 26.7 26.0 - 34.0 pg   MCHC 32.4 30.0 - 36.0 g/dL   RDW 72.514.0 36.611.5 - 44.015.5 %   Platelets 223 150 - 400 K/uL   nRBC 0.0 0.0 - 0.2 %    Comment: Performed at [facility], [address] [place], [place] [address]  Glucose, capillary     Status: Abnormal   Collection Time: 08/21/19 11:16 AM  Result Value Ref Range   Glucose-Capillary 194 (H) 70 - 99 mg/dL  Comment: Glucose reference range applies only to samples taken after fasting for at least 8 hours.    DG Foot Complete Left  Result Date: 08/20/2019 CLINICAL DATA:  Acute left foot pain and swelling without known injury. EXAM: LEFT FOOT - COMPLETE 3+ VIEW COMPARISON:  None. FINDINGS: Minimally displaced fracture is seen involving the proximal base of the fifth proximal phalanx which may be acute or subacute. Joint spaces are intact. Mild posterior calcaneal spur for ring is noted. Dorsal soft tissue swelling is noted. IMPRESSION: Minimally displaced fifth proximal phalangeal fracture is noted which may be acute or subacute. Dorsal soft tissue swelling is noted. Electronically Signed   By: Lupita Raider M.D.   On:  08/20/2019 08:08   CT EXTREMITY LOWER LEFT W CONTRAST  Result Date: 08/20/2019 CLINICAL DATA:  Left lower leg pain and swelling for 1 week. Skin wound on the left third and fourth toes. EXAM: CT OF THE LOWER LEFT EXTREMITY WITH CONTRAST TECHNIQUE: Multidetector CT imaging of the lower left extremity was performed according to the standard protocol following intravenous contrast administration. COMPARISON:  Plain films of the left foot earlier today. CONTRAST:  100 mL OMNIPAQUE IOHEXOL 300 MG/ML  SOLN FINDINGS: Bones/Joint/Cartilage No acute or focal bony or joint abnormality is identified. There is some osteophytosis about the knee. Ligaments Suboptimally assessed by CT. Muscles and Tendons No intramuscular fluid collection is seen. No gas within muscle or tracking along fascial planes. There is mild fatty atrophy of all imaged muscle bellies. Soft tissues Reported skin wounds on the third and fourth toes are not visible on this study. Diffuse subcutaneous edema is present. No soft tissue gas or radiopaque foreign body. No focal fluid collection. IMPRESSION: Diffuse subcutaneous edema consistent with dependent change or cellulitis. The exam is otherwise negative. Electronically Signed   By: Drusilla Kanner M.D.   On: 08/20/2019 10:56   VAS [place] LOWER EXTREMITY VENOUS (DVT) (ONLY [facility] & [facility])  Result Date: 08/21/2019  Lower Venous DVTStudy Indications: Edema.  Comparison Study: no prior Performing Technologist: Blanch Media RVS  Examination Guidelines: A complete evaluation includes B-mode imaging, spectral Doppler, color Doppler, and power Doppler as needed of all accessible portions of each vessel. Bilateral testing is considered an integral part of a complete examination. Limited examinations for reoccurring indications may be performed as noted. The reflux portion of the exam is performed with the patient in reverse Trendelenburg.  +-----+---------------+---------+-----------+----------+--------------+  RIGHTCompressibilityPhasicitySpontaneityPropertiesThrombus Aging +-----+---------------+---------+-----------+----------+--------------+ CFV  Full           Yes      Yes                                 +-----+---------------+---------+-----------+----------+--------------+   +---------+---------------+---------+-----------+----------+--------------+ LEFT     CompressibilityPhasicitySpontaneityPropertiesThrombus Aging +---------+---------------+---------+-----------+----------+--------------+ CFV      Full           Yes      Yes                                 +---------+---------------+---------+-----------+----------+--------------+ SFJ      Full                                                        +---------+---------------+---------+-----------+----------+--------------+  FV Prox  Full                                                        +---------+---------------+---------+-----------+----------+--------------+ FV Mid   Full                                                        +---------+---------------+---------+-----------+----------+--------------+ FV DistalFull                                                        +---------+---------------+---------+-----------+----------+--------------+ PFV      Full                                                        +---------+---------------+---------+-----------+----------+--------------+ POP      Full           Yes      Yes                                 +---------+---------------+---------+-----------+----------+--------------+ PTV      Full                                                        +---------+---------------+---------+-----------+----------+--------------+ PERO                                                  Not visualized +---------+---------------+---------+-----------+----------+--------------+     Summary: RIGHT: - No evidence of common femoral vein  obstruction.  LEFT: - There is no evidence of deep vein thrombosis in the lower extremity.  - No cystic structure found in the popliteal fossa.  *See table(s) above for measurements and observations. Electronically signed by Waverly Ferrari MD on 08/21/2019 at 4:24:44 AM.    Final     Review of Systems Blood pressure (!) 148/84, pulse 83, temperature 97.7 F (36.5 C), temperature source Oral, resp. rate 17, height 5\' 11"  (1.803 m), weight (!) 97.5 kg, SpO2 96 %. Physical Exam Vitals reviewed.  Constitutional:      Appearance: Normal appearance.  HENT:     Head: Normocephalic and atraumatic.  Eyes:     Pupils: Pupils are equal, round, and reactive to light.  Cardiovascular:     Rate and Rhythm: Normal rate.  Pulmonary:     Breath sounds: Normal breath sounds.  Abdominal:     Palpations: Abdomen is soft.  Musculoskeletal:     Cervical back: Normal range of motion.  Right foot: Abnormal pulse.     Left foot: Abnormal pulse.       Legs:  Neurological:     Mental Status: He is alert and oriented to person, place, and time.  Psychiatric:        Behavior: Behavior normal.     Assessment/Plan: Small left foot fourth toe wound and subacute fracture of the fifth proximal phalanx  Fortunately thus far, there is no surgical treatment that is needed for his foot.  He can weight-bear as tolerated in a postop shoe.  I have spoken with him about washing his foot daily and soaking it in room temperature antibacterial soapy water for about 15 minutes and then drying it off for a while and placing a small amount of Bactroban ointment over the fourth toe wound daily.  I can see him back in follow-up in the office in 1 to 2 weeks.  Kathryne Hitch 08/21/2019, 11:40 AM

## 2019-08-21 NOTE — Evaluation (Signed)
Physical Therapy Evaluation Patient Details Name: Bobby Fernandez MRN: [id number] DOB: 04-27-1960 Today's Date: 08/21/2019   History of Present Illness  59 year old male presenting for evaluation of left lower extremity swelling ,pain behind the left kneehas  a wound to the left fourth digit.  Clinical Impression  The patient ambulated without AD with increased pain and antalgia of left foot. Patient  Ambulated with RW for decreased WB increased stability due to intolerance of WB without AD. Marland Kitchen Encouraged use of RW in room. Will assess single point cane which should benefit patient at DC as left foot pain and edema  Improves. Patient reports that the left  foot is much improved, able to flex left knee now. PT will return today and assess use of SPC.  Pt admitted with above diagnosis.  Pt currently with functional limitations due to the deficits listed below (see PT Problem List). Pt will benefit from skilled PT to increase their independence and safety with mobility to allow discharge to the venue listed below.       Follow Up Recommendations No PT follow up    Equipment Recommendations  Cane -once PT assesses   Recommendations for Other Services       Precautions / Restrictions Precautions Precautions: None      Mobility  Bed Mobility Overal bed mobility: Independent                Transfers Overall transfer level: Independent                  Ambulation/Gait Ambulation/Gait assistance: Modified independent (Device/Increase time) Gait Distance (Feet): 40 Feet Assistive device: Rolling walker (2 wheeled);None Gait Pattern/deviations: Step-to pattern;Antalgic Gait velocity: decr   General Gait Details: amb 10' without Rw, more antalgic, used RW with less discomfort of left foot and  decreased WB  Stairs            Wheelchair Mobility    Modified Rankin (Stroke Patients Only)       Balance                                              Pertinent Vitals/Pain Pain Assessment: 0-10 Pain Score: 5  Pain Location: when weight bearing left Pain Descriptors / Indicators: Discomfort;Grimacing;Pressure Pain Intervention(s): Monitored during session    Home Living Family/patient expects to be discharged to:: Private residence Living Arrangements: Alone Available Help at Discharge: Family;Available PRN/intermittently Type of Home: House Home Access: Stairs to enter Entrance Stairs-Rails: Doctor, general practice of Steps: 2 Home Layout: One level Home Equipment: None      Prior Function Level of Independence: Independent               Hand Dominance   Dominant Hand: Right    Extremity/Trunk Assessment   Upper Extremity Assessment Upper Extremity Assessment: Overall WFL for tasks assessed    Lower Extremity Assessment Lower Extremity Assessment: Overall WFL for tasks assessed;LLE deficits/detail LLE Deficits / Details: noted edema, has dressing on foot.    Cervical / Trunk Assessment Cervical / Trunk Assessment: Normal  Communication   Communication: No difficulties  Cognition Arousal/Alertness: Awake/alert Behavior During Therapy: WFL for tasks assessed/performed Overall Cognitive Status: Within Functional Limits for tasks assessed  General Comments      Exercises     Assessment/Plan    PT Assessment Patient needs continued PT services  PT Problem List Decreased mobility;Decreased activity tolerance;Pain       PT Treatment Interventions DME instruction;Therapeutic activities;Gait training;Patient/family education;Functional mobility training    PT Goals (Current goals can be found in the Care Plan section)  Acute Rehab PT Goals Patient Stated Goal: go home PT Goal Formulation: With patient Time For Goal Achievement: 08/27/19 Potential to Achieve Goals: Good    Frequency Min 3X/week   Barriers to discharge         Co-evaluation               AM-PAC PT "6 Clicks" Mobility  Outcome Measure Help needed turning from your back to your side while in a flat bed without using bedrails?: None Help needed moving from lying on your back to sitting on the side of a flat bed without using bedrails?: None Help needed moving to and from a bed to a chair (including a wheelchair)?: None Help needed standing up from a chair using your arms (e.g., wheelchair or bedside chair)?: None Help needed to walk in hospital room?: A Little Help needed climbing 3-5 steps with a railing? : A Little 6 Click Score: 22    End of Session   Activity Tolerance: Patient tolerated treatment well Patient left: in bed;with call bell/phone within reach Nurse Communication: Mobility status PT Visit Diagnosis: Unsteadiness on feet (R26.81);Pain Pain - Right/Left: Left Pain - part of body: Ankle and joints of foot    Time: 0900-0911 PT Time Calculation (min) (ACUTE ONLY): 11 min   Charges:   PT Evaluation $PT Eval Low Complexity: 1 Low          Blanchard Kelch PT Acute Rehabilitation Services Pager [phone] Office [phone]   Rada Hay 08/21/2019, 9:19 AM

## 2019-08-22 DIAGNOSIS — L03116 Cellulitis of left lower limb: Secondary | ICD-10-CM | POA: Diagnosis not present

## 2019-08-22 LAB — CBC WITH DIFFERENTIAL/PLATELET
Abs Immature Granulocytes: 0.03 10*3/uL (ref 0.00–0.07)
Basophils Absolute: 0.1 10*3/uL (ref 0.0–0.1)
Basophils Relative: 2 %
Eosinophils Absolute: 0.1 10*3/uL (ref 0.0–0.5)
Eosinophils Relative: 3 %
HCT: 35.4 % — ABNORMAL LOW (ref 39.0–52.0)
Hemoglobin: 11.5 g/dL — ABNORMAL LOW (ref 13.0–17.0)
Immature Granulocytes: 1 %
Lymphocytes Relative: 25 %
Lymphs Abs: 1.1 10*3/uL (ref 0.7–4.0)
MCH: 26.5 pg (ref 26.0–34.0)
MCHC: 32.5 g/dL (ref 30.0–36.0)
MCV: 81.6 fL (ref 80.0–100.0)
Monocytes Absolute: 0.5 10*3/uL (ref 0.1–1.0)
Monocytes Relative: 10 %
Neutro Abs: 2.7 10*3/uL (ref 1.7–7.7)
Neutrophils Relative %: 59 %
Platelets: 227 10*3/uL (ref 150–400)
RBC: 4.34 MIL/uL (ref 4.22–5.81)
RDW: 14 % (ref 11.5–15.5)
WBC: 4.5 10*3/uL (ref 4.0–10.5)
nRBC: 0 % (ref 0.0–0.2)

## 2019-08-22 LAB — BASIC METABOLIC PANEL
Anion gap: 10 (ref 5–15)
BUN: 11 mg/dL (ref 6–20)
CO2: 22 mmol/L (ref 22–32)
Calcium: 8.9 mg/dL (ref 8.9–10.3)
Chloride: 106 mmol/L (ref 98–111)
Creatinine, Ser: 0.63 mg/dL (ref 0.61–1.24)
GFR calc Af Amer: >60 mL/min (ref >60)
GFR calc non Af Amer: >60 mL/min (ref >60)
Glucose, Bld: 186 mg/dL — ABNORMAL HIGH (ref 70–99)
Potassium: 4.6 mmol/L (ref 3.5–5.1)
Sodium: 138 mmol/L (ref 135–145)

## 2019-08-22 LAB — GLUCOSE, CAPILLARY
Glucose-Capillary: 156 mg/dL — ABNORMAL HIGH (ref 70–99)
Glucose-Capillary: 215 mg/dL — ABNORMAL HIGH (ref 70–99)

## 2019-08-22 MED ORDER — DOXYCYCLINE HYCLATE 50 MG PO CAPS
100.0000 mg | ORAL_CAPSULE | Freq: Two times a day (BID) | ORAL | 0 refills | Status: AC
Start: 2019-08-22 — End: 2019-09-03

## 2019-08-22 MED ORDER — INSULIN GLARGINE 100 UNIT/ML ~~LOC~~ SOLN
4.0000 [IU] | Freq: Every day | SUBCUTANEOUS | 3 refills | Status: DC
Start: 2019-08-22 — End: 2021-09-25

## 2019-08-22 MED ORDER — BLOOD GLUCOSE MONITOR KIT: PACK | 0 refills | Status: DC

## 2019-08-22 MED ORDER — TRAMADOL HCL 50 MG PO TABS: 50.0000 mg | ORAL_TABLET | Freq: Four times a day (QID) | ORAL | 0 refills | Status: DC | PRN

## 2019-08-22 NOTE — Discharge Summary (Signed)
Physician Discharge Summary  [name] YPP:[id number] DOB: 04-Aug-1960 DOA: 08/20/2019  PCP: Patient, No Pcp Per  Admit date: 08/20/2019 Discharge date: 08/22/2019  Recommendations for Outpatient Follow-up:  Discharge to home Soak left foot in room temperature antibacterial soapy water for 15 minutes each day. Dry completely. Then put a thin layer of bactroban ointment on the 4th toe and cover. Keep foot clean. Do not break blisters. Wear Post-op shoe if not in bed.  Follow up with PCP in 7-10 days. Take glucose measurements in to visit. Follow up with Dr. Jean Rosenthal in 1-2 weeks as outpatient.   Follow-up Information    Mcarthur Rossetti, MD. Schedule an appointment as soon as possible for a visit in 2 week(s).   Specialty: Orthopedic Surgery Contact information: [place] [address] [place] [facility] up.   Why: please call on Monday morning to schedule a visit and establish services with a primary care doctor.  Contact information: [place] [address] [phone]             Discharge Diagnoses: Principal diagnosis is #1 1. Cellulitis of the left lower extremity 2. Ulceration of the fourth digit of the left foot. 3. Fracture of base of proximal phalanx of the fifth digit of the left foot. 4. DM II - Uncontrolled with HbA1c of 9.9  Discharge Condition: Fair  Disposition: Home  Diet recommendation: Modified carbohydrate  Filed Weights   08/20/19 0229  Weight: (!) 97.5 kg   History of present illness: Bobby Fernandez is a 59 y.o. male with medical history significant for diabetes not on medication comes to the ED for evaluation of left leg wound and swelling for a week.  Patient complains of left lower extremity swelling about a week ago with pain behind the left knee, he has only on the left fourth digit after sustaining a cut with some seizure.   He had cleaned with water and hydrogen peroxide.  Patient otherwise denies any nausea, vomiting, chest pain, shortness of breath, fever, chills, headache, focal weakness, numbness tingling, speech difficulties. Patient reports he used ot be on insulin pen last year but due to pandemic and long que in the pharmacy, he is not able to get medication from the state pharmacy, and has not been using anything.  ED Course: Vitals stable afebrile, 7 normal lactic acid normal WBC count.  Duplex was negative for DVT.  Given his significant left lower extremity patient was placed on vancomycin and Zosyn and I is being admitted for further management.  Hospital Course: The patient has been admitted to a medical bed. He has been started on Zosyn and vancomycin for the ulcerations on the 4th and 5th digits of the left foot and cellulitis of the left lower extremity. He states that a lot of the swelling in the extremity has gone away. X-ray of the left foot demonstrated fracture of the proximal phalanx of the 5th digit of the right foot. They have recommended a post-op boot for the left lower extremity. They have also recommended washing the foot daily in antibacterial soapy water for 15 minutes, drying it and them applying bactroban ointment over the 4th toe daily. They will follow him up as outpatient in 1-2 weeks. Blood cultures x 2 have been obtained and have had no growth.  The patient has been evaluated by PT. They have recommended a single point cane prior to  discharge. DVT in the left lower extremity has been ruled out by doppler.  Today's assessment: S: The patient is resting comfortably. No new complaints. O: Vitals:  Vitals:   08/22/19 0524 08/22/19 1404  BP: (!) 154/87 (!) 158/84  Pulse: 77 79  Resp: 16 18  Temp: 97.7 F (36.5 C) (!) 97.5 F (36.4 C)  SpO2: 93% 98%   Exam:  Constitutional:   The patient is awake, alert, and oriented x 3. No acute distress. Respiratory:   No increased  work of breathing.  No wheezes, rales, or rhonchi  No tactile fremitus Cardiovascular:   Regular rate and rhythm  No murmurs, ectopy, or gallups.  No lateral PMI. No thrills. Abdomen:   Abdomen is soft, non-tender, non-distended  No hernias, masses, or organomegaly  Normoactive bowel sounds.  Musculoskeletal:   No cyanosis, clubbing, or edema  Left lower extremity is swollen.   Left foot is erythematous and swollen. The 4th digit is ulcerated, and the fifth digit is disfigured. Skin:   No rashes or lesions  There are ulcers to the 4th and 5th digits of the left foot.  There is also erythema and swelling to the left lower extremity and foot.  palpation of skin: no induration or nodules Neurologic:   CN 2-12 intact  Sensation all 4 extremities intact Psychiatric:   Mental status ? Mood, affect appropriate ? Orientation to person, place, time   judgment and insight appear intact   Discharge Instructions  Discharge Instructions    Activity as tolerated - No restrictions   Complete by: As directed    Call MD for:  redness, tenderness, or signs of infection (pain, swelling, redness, odor or green/yellow discharge around incision site)   Complete by: As directed    Call MD for:  severe uncontrolled pain   Complete by: As directed    Call MD for:  temperature >100.4   Complete by: As directed    Diet - low sodium heart healthy   Complete by: As directed    Discharge instructions   Complete by: As directed    Discharge to home Soak left foot in room temperature antibacterial soapy water for 15 minutes each day. Dry completely. Then put a thin layer of bactroban ointment on the 4th toe and cover. Keep foot clean. Do not break blisters. Wear Post-op shoe if not in bed.  Follow up with PCP in 7-10 days. Take glucose measurements in to visit. Follow up with Dr. Jean Rosenthal in 1-2 weeks as outpatient.   Discharge wound care:   Complete by: As directed     Wound care to 4th digit of left foot full thickness wound:  Cleanse with soap and water, pat dry.  Apply a small amount of bactroban (MupiroWound care to 4th digit of left foot full thickness wound:  Cleanse with soap and water, pat dry.  Apply a small amount of bactroban (Mupirocin) ointment to wound, top with dry gauze 2x2 and secure with tape.  Change daily.  08/22/19 1003   Increase activity slowly   Complete by: As directed      Allergies as of 08/22/2019   No Known Allergies     Medication List    STOP taking these medications   naproxen sodium 220 MG tablet Commonly known as: ALEVE     TAKE these medications   blood glucose meter kit and supplies Kit Dispense based on patient and insurance preference. Use up to four times daily as directed. (FOR  ICD-9 250.00, 250.01). Check Glucose once daily. Record values and take in when you visit your PCP.   doxycycline 50 MG capsule Commonly known as: VIBRAMYCIN Take 2 capsules (100 mg total) by mouth 2 (two) times daily for 12 days.   insulin glargine 100 UNIT/ML injection Commonly known as: LANTUS Inject 0.04 mLs (4 Units total) into the skin daily.   traMADol 50 MG tablet Commonly known as: ULTRAM Take 1 tablet (50 mg total) by mouth every 6 (six) hours as needed for severe pain.            Durable Medical Equipment  (From admission, onward)         Start     Ordered   08/21/19 1528  For home use only DME Cane  Once        08/21/19 1527           Discharge Care Instructions  (From admission, onward)         Start     Ordered   08/22/19 0000  Discharge wound care:       Comments: Wound care to 4th digit of left foot full thickness wound:  Cleanse with soap and water, pat dry.  Apply a small amount of bactroban (MupiroWound care to 4th digit of left foot full thickness wound:  Cleanse with soap and water, pat dry.  Apply a small amount of bactroban (Mupirocin) ointment to wound, top with dry gauze 2x2 and  secure with tape.  Change daily.  08/22/19 1003   08/22/19 1205         No Known Allergies  The results of significant diagnostics from this hospitalization (including imaging, microbiology, ancillary and laboratory) are listed below for reference.    Significant Diagnostic Studies: DG Foot Complete Left  Result Date: 08/20/2019 CLINICAL DATA:  Acute left foot pain and swelling without known injury. EXAM: LEFT FOOT - COMPLETE 3+ VIEW COMPARISON:  None. FINDINGS: Minimally displaced fracture is seen involving the proximal base of the fifth proximal phalanx which may be acute or subacute. Joint spaces are intact. Mild posterior calcaneal spur for ring is noted. Dorsal soft tissue swelling is noted. IMPRESSION: Minimally displaced fifth proximal phalangeal fracture is noted which may be acute or subacute. Dorsal soft tissue swelling is noted. Electronically Signed   By: Marijo Conception M.D.   On: 08/20/2019 08:08   CT EXTREMITY LOWER LEFT W CONTRAST  Result Date: 08/20/2019 CLINICAL DATA:  Left lower leg pain and swelling for 1 week. Skin wound on the left third and fourth toes. EXAM: CT OF THE LOWER LEFT EXTREMITY WITH CONTRAST TECHNIQUE: Multidetector CT imaging of the lower left extremity was performed according to the standard protocol following intravenous contrast administration. COMPARISON:  Plain films of the left foot earlier today. CONTRAST:  100 mL OMNIPAQUE IOHEXOL 300 MG/ML  SOLN FINDINGS: Bones/Joint/Cartilage No acute or focal bony or joint abnormality is identified. There is some osteophytosis about the knee. Ligaments Suboptimally assessed by CT. Muscles and Tendons No intramuscular fluid collection is seen. No gas within muscle or tracking along fascial planes. There is mild fatty atrophy of all imaged muscle bellies. Soft tissues Reported skin wounds on the third and fourth toes are not visible on this study. Diffuse subcutaneous edema is present. No soft tissue gas or radiopaque  foreign body. No focal fluid collection. IMPRESSION: Diffuse subcutaneous edema consistent with dependent change or cellulitis. The exam is otherwise negative. Electronically Signed   By: Inge Rise  M.D.   On: 08/20/2019 10:56   VAS [place] LOWER EXTREMITY VENOUS (DVT) (ONLY [facility] & [facility])  Result Date: 08/21/2019  Lower Venous DVTStudy Indications: Edema.  Comparison Study: no prior Performing Technologist: Abram Sander RVS  Examination Guidelines: A complete evaluation includes B-mode imaging, spectral Doppler, color Doppler, and power Doppler as needed of all accessible portions of each vessel. Bilateral testing is considered an integral part of a complete examination. Limited examinations for reoccurring indications may be performed as noted. The reflux portion of the exam is performed with the patient in reverse Trendelenburg.  +-----+---------------+---------+-----------+----------+--------------+ RIGHTCompressibilityPhasicitySpontaneityPropertiesThrombus Aging +-----+---------------+---------+-----------+----------+--------------+ CFV  Full           Yes      Yes                                 +-----+---------------+---------+-----------+----------+--------------+   +---------+---------------+---------+-----------+----------+--------------+ LEFT     CompressibilityPhasicitySpontaneityPropertiesThrombus Aging +---------+---------------+---------+-----------+----------+--------------+ CFV      Full           Yes      Yes                                 +---------+---------------+---------+-----------+----------+--------------+ SFJ      Full                                                        +---------+---------------+---------+-----------+----------+--------------+ FV Prox  Full                                                        +---------+---------------+---------+-----------+----------+--------------+ FV Mid   Full                                                         +---------+---------------+---------+-----------+----------+--------------+ FV DistalFull                                                        +---------+---------------+---------+-----------+----------+--------------+ PFV      Full                                                        +---------+---------------+---------+-----------+----------+--------------+ POP      Full           Yes      Yes                                 +---------+---------------+---------+-----------+----------+--------------+ PTV      Full                                                        +---------+---------------+---------+-----------+----------+--------------+  PERO                                                  Not visualized +---------+---------------+---------+-----------+----------+--------------+     Summary: RIGHT: - No evidence of common femoral vein obstruction.  LEFT: - There is no evidence of deep vein thrombosis in the lower extremity.  - No cystic structure found in the popliteal fossa.  *See table(s) above for measurements and observations. Electronically signed by Deitra Mayo MD on 08/21/2019 at 4:24:44 AM.    Final     Microbiology: Recent Results (from the past 240 hour(s))  Blood culture (routine x 2)     Status: None (Preliminary result)   Collection Time: 08/20/19  9:28 AM   Specimen: BLOOD  Result Value Ref Range Status   Specimen Description   Final    BLOOD LEFT WRIST Performed at [facility] [address], [place], [place] [address]    Special Requests   Final    BOTTLES DRAWN AEROBIC AND ANAEROBIC Blood Culture adequate volume Performed at [facility] [address] [place], [place] [address]    Culture   Final    NO GROWTH 2 DAYS Performed at [facility] [address] [place], [place] [address]    Report Status PENDING  Incomplete  Blood culture (routine x 2)     Status:  None (Preliminary result)   Collection Time: 08/20/19 10:25 AM   Specimen: BLOOD LEFT HAND  Result Value Ref Range Status   Specimen Description   Final    BLOOD LEFT HAND Performed at [facility] [address], [place], [place] [address]    Special Requests   Final    BOTTLES DRAWN AEROBIC AND ANAEROBIC Blood Culture results may not be optimal due to an excessive volume of blood received in culture bottles Performed at [facility] [address] [place], [place] [address]    Culture   Final    NO GROWTH 2 DAYS Performed at [facility] [address] [place], [place] [address]    Report Status PENDING  Incomplete  SARS Coronavirus 2 by RT PCR (hospital order, performed in [facility] hospital lab) Nasopharyngeal Nasopharyngeal Swab     Status: None   Collection Time: 08/20/19 12:30 PM   Specimen: Nasopharyngeal Swab  Result Value Ref Range Status   SARS Coronavirus 2 NEGATIVE NEGATIVE Final    Comment: (NOTE) SARS-CoV-2 target nucleic acids are NOT DETECTED.  The SARS-CoV-2 RNA is generally detectable in upper and lower respiratory specimens during the acute phase of infection. The lowest concentration of SARS-CoV-2 viral copies this assay can detect is 250 copies / mL. A negative result does not preclude SARS-CoV-2 infection and should not be used as the sole basis for treatment or other patient management decisions.  A negative result may occur with improper specimen collection / handling, submission of specimen other than nasopharyngeal swab, presence of viral mutation(s) within the areas targeted by this assay, and inadequate number of viral copies (<250 copies / mL). A negative result must be combined with clinical observations, patient history, and epidemiological information.  Fact Sheet for Patients:   StrictlyIdeas.no  Fact Sheet for Healthcare  Providers: BankingDealers.co.za  This test is not yet approved or  cleared by the [place]  FDA and has been authorized for detection and/or diagnosis of SARS-CoV-2 by FDA under an Emergency Use Authorization (EUA).  This EUA will remain in effect (meaning this test can be used) for the duration of the COVID-19 declaration under Section 564(b)(1) of the Act, 21 U.S.C. section 360bbb-3(b)(1), unless the authorization is terminated or revoked sooner.  Performed at [facility], [place] [address], [place], [place] [address]      Labs: Basic Metabolic Panel: Recent Labs  Lab 08/20/19 0816 08/21/19 0618 08/22/19 0634  NA 135 136 138  K 4.3 4.0 4.6  CL 102 106 106  CO2 _0 GLUCOSE 227* 152* 186*  BUN _1 CREATININE 0.57* 0.48* 0.63  CALCIUM 9.1 8.5* 8.9   Liver Function Tests: Recent Labs  Lab 08/21/19 0618  AST 13*  ALT 14  ALKPHOS 96  BILITOT 1.0  PROT 6.3*  ALBUMIN 3.4*   No results for input(s): LIPASE, AMYLASE in the last 168 hours. No results for input(s): AMMONIA in the last 168 hours. CBC: Recent Labs  Lab 08/20/19 0816 08/21/19 0618 08/22/19 0634  WBC 6.0 4.3 4.5  NEUTROABS 4.0  --  2.7  HGB 13.1 11.5* 11.5*  HCT 40.9 35.5* 35.4*  MCV 82.5 82.6 81.6  PLT 255 223 227   Cardiac Enzymes: No results for input(s): CKTOTAL, CKMB, CKMBINDEX, TROPONINI in the last 168 hours. BNP: BNP (last 3 results) No results for input(s): BNP in the last 8760 hours.  ProBNP (last 3 results) No results for input(s): PROBNP in the last 8760 hours.  CBG: Recent Labs  Lab 08/21/19 1116 08/21/19 1738 08/21/19 2127 08/22/19 0725 08/22/19 1115  GLUCAP 194* 145* 184* 156* 215*    Principal Problem:   Left leg cellulitis Active Problems:   Toe ulcer, left, limited to breakdown of skin (HCC)   Toe fracture, left   DM hyperosmolarity type II, uncontrolled ([place])   Time coordinating discharge: 38  minutes  Signed:        Pawel Soules, DO [facility]  08/22/2019, 7:02 PM

## 2019-08-22 NOTE — Consult Note (Signed)
WOC Nurse Consult Note: WOC consult placed simultaneously with Orthopedics consult for left foot 4th digit wound care.  Dr. Magnus Ivan saw yesterday and provided guidance for Nursing using Bactroban (Mupirocin) ointment once daily after soap and water cleanse. I have provided those Orders to the Nursing Wound care TaskList.    WOC nursing team will not follow, but will remain available to this patient, the nursing and medical teams.  Please re-consult if needed. Thanks, Ladona Mow, MSN, RN, GNP, [place]  Pager# [phone]

## 2019-08-22 NOTE — TOC Initial Note (Signed)
Transition of Care [facility]) - Initial/Assessment Note    Patient Details  Name: Bobby Fernandez MRN: [id number] Date of Birth: 10-11-1960  Transition of [facility] CM/SW Contact:    Armanda Heritage, RN Phone Number: 08/22/2019, 12:14 PM  Clinical Narrative:       [employer] to deliver cane to bedside.  Information for pcp clinic placed on AVS. CM unable to schedule pcp visit due to office closed for the weekend.             Expected Discharge Plan: Home/Self Care Barriers to Discharge: No Barriers Identified   Patient Goals and CMS Choice Patient states their goals for this hospitalization and ongoing recovery are:: to go home today      Expected Discharge Plan and Services Expected Discharge Plan: Home/Self Care   Discharge Planning Services: CM Consult     Expected Discharge Date: 08/22/19               DME Arranged: Gilmer Mor DME Agency: [employer] Date DME Agency Contacted: 08/22/19 Time DME Agency Contacted: 1213 Representative spoke with at DME Agency: [facility] HH Arranged: NA HH Agency: NA        Prior Living Arrangements/Services     Patient language and need for interpreter reviewed:: Yes Do you feel safe going back to the place where you live?: Yes      Need for Family Participation in Patient Care: Yes (Comment) Care giver support system in place?: Yes (comment)   Criminal Activity/Legal Involvement Pertinent to Current Situation/Hospitalization: No - Comment as needed  Activities of Daily Living Home Assistive Devices/Equipment: None ADL Screening (condition at time of admission) Patient's cognitive ability adequate to safely complete daily activities?: Yes Is the patient deaf or have difficulty hearing?: No Does the patient have difficulty seeing, even when wearing glasses/contacts?: No Does the patient have difficulty concentrating, remembering, or making decisions?: No Patient able to express need for assistance with ADLs?: Yes Does the patient have  difficulty dressing or bathing?: No Independently performs ADLs?: Yes (appropriate for developmental age) Does the patient have difficulty walking or climbing stairs?: Yes Weakness of Legs: None Weakness of Arms/Hands: None  Permission Sought/Granted                  Emotional Assessment           Psych Involvement: No (comment)  Admission diagnosis:  Cellulitis of right lower extremity [L03.115] Left leg cellulitis [L03.116] Patient Active Problem List   Diagnosis Date Noted  . Toe ulcer, left, limited to breakdown of skin (HCC) 08/21/2019  . Toe fracture, left 08/21/2019  . DM hyperosmolarity type II, uncontrolled (HCC) 08/21/2019  . Left leg cellulitis 08/20/2019   PCP:  Patient, No Pcp Per Pharmacy:   [id number] [place], [place] - [address] [place] [address] Phone: [phone] Fax: [phone]     Social Determinants of Health (SDOH) Interventions    Readmission Risk Interventions No flowsheet data found.

## 2019-08-22 NOTE — Progress Notes (Signed)
Patient discharged to home, discharge instructions reviewed with patient who verbalized understanding.  

## 2019-08-25 LAB — CULTURE, BLOOD (ROUTINE X 2)
Culture: NO GROWTH
Culture: NO GROWTH
Special Requests: ADEQUATE

## 2019-09-01 ENCOUNTER — Encounter: Payer: Self-pay | Admitting: Family

## 2019-09-01 ENCOUNTER — Ambulatory Visit (INDEPENDENT_AMBULATORY_CARE_PROVIDER_SITE_OTHER): Payer: 59 | Admitting: Family

## 2019-09-01 VITALS — Ht 71.0 in | Wt 215.0 lb

## 2019-09-01 DIAGNOSIS — L97521 Non-pressure chronic ulcer of other part of left foot limited to breakdown of skin: Secondary | ICD-10-CM | POA: Diagnosis not present

## 2019-09-01 DIAGNOSIS — L03116 Cellulitis of left lower limb: Secondary | ICD-10-CM | POA: Diagnosis not present

## 2019-09-01 NOTE — Progress Notes (Signed)
Office Visit Note   Patient: Bobby Fernandez           Date of Birth: 1960-02-23           MRN: [id number] Visit Date: 09/01/2019              Requested by: No referring provider defined for this encounter. PCP: Patient, No Pcp Per  Chief Complaint  Patient presents with  . Left Leg - Follow-up    ER for cellulitus 08/20/19      HPI: The patient is a 59 year old gentleman who presents today in hospitalization follow-up.  He was seen and evaluated in the emergency department and had a 3-day hospital stay for cellulitis of the left lower extremity.  He was seen by Dr. Magnus Ivan during hospitalization in consultation.  He had been continues to have significant edema of the left lower extremity and did have an ulcer he reports it was traumatic over the dorsal aspect of his fourth toe.  He had been washing this with peroxide.  Radiographs of the left foot showed a nondisplaced fracture of the fifth toe proximal phalanx of base.  This was felt to be remote.  He was treated with vancomycin IV for the cellulitis.  His hemoglobin is above 9.  Following hospital discharge he has seen the redness and warmth greatly improved he has not had any fevers or chills or worsening of his wound however the edema of his lower extremity has not improved.  He states that he has pain in his knee with flexion.   Assessment & Plan: Visit Diagnoses: No diagnosis found.  Plan: He will proceed with Neosporin dressing changes of the ulcer.  Placed a compressive wrap, Dynaflex to the left lower extremity he will follow-up in the office on Monday with Dr. Lajoyce Corners as scheduled.  Did provide a note for work that he will need to do seated light duty work so he can elevate his left lower extremity  Follow-Up Instructions: Return in about 1 week (around 09/08/2019), or as scheduled.   Ortho Exam  Patient is alert, oriented, no adenopathy, well-dressed, normal affect, normal respiratory effort. On examination of the left  lower extremity there is significant 2+ pitting edema to the left lower extremity there is no associated erythema no warmth no weeping.  He does have a 5 mm x 3 mm ulcer over the dorsal aspect of his fourth toe this is dry there is no surrounding erythema no probing no drainage  Imaging: No results found. No images are attached to the encounter.  Labs: Lab Results  Component Value Date   HGBA1C 9.9 (H) 08/20/2019   REPTSTATUS 08/25/2019 FINAL 08/20/2019   CULT  08/20/2019    NO GROWTH 5 DAYS Performed at [facility], [address], [place], [place] [address]      Lab Results  Component Value Date   ALBUMIN 3.4 (L) 08/21/2019    No results found for: MG No results found for: VD25OH  No results found for: PREALBUMIN CBC EXTENDED Latest Ref Rng & Units 08/22/2019 08/21/2019 08/20/2019  WBC 4.0 - 10.5 K/uL 4.5 4.3 6.0  RBC 4.22 - 5.81 MIL/uL 4.34 4.30 4.96  HGB 13.0 - 17.0 g/dL 11.5(L) 11.5(L) 13.1  HCT 39 - 52 % 35.4(L) 35.5(L) 40.9  PLT 150 - 400 K/uL 227 223 255  NEUTROABS 1.7 - 7.7 K/uL 2.7 - 4.0  LYMPHSABS 0.7 - 4.0 K/uL 1.1 - 1.2     Body mass index is  29.99 kg/m.  Orders:  No orders of the defined types were placed in this encounter.  No orders of the defined types were placed in this encounter.    Procedures: No procedures performed  Clinical Data: No additional findings.  ROS:  All other systems negative, except as noted in the HPI. Review of Systems  Objective: Vital Signs: Ht 5\' 11"  (1.803 m)   Wt 215 lb (97.5 kg)   BMI 29.99 kg/m   Specialty Comments:  No specialty comments available.  PMFS History: Patient Active Problem List   Diagnosis Date Noted  . Toe ulcer, left, limited to breakdown of skin (HCC) 08/21/2019  . Toe fracture, left 08/21/2019  . DM hyperosmolarity type II, uncontrolled (HCC) 08/21/2019  . Left leg cellulitis 08/20/2019   History reviewed. No pertinent past medical history.  History reviewed. No pertinent  family history.  History reviewed. No pertinent surgical history. Social History   Occupational History  . Not on file  Tobacco Use  . Smoking status: Former 08/22/2019  . Smokeless tobacco: Never Used  Substance and Sexual Activity  . Alcohol use: Not on file  . Drug use: Not on file  . Sexual activity: Not on file

## 2019-09-06 ENCOUNTER — Encounter: Payer: Self-pay | Admitting: Physician Assistant

## 2019-09-06 ENCOUNTER — Ambulatory Visit (INDEPENDENT_AMBULATORY_CARE_PROVIDER_SITE_OTHER): Payer: Managed Care, Other (non HMO) | Admitting: Physician Assistant

## 2019-09-06 DIAGNOSIS — L03116 Cellulitis of left lower limb: Secondary | ICD-10-CM

## 2019-09-06 DIAGNOSIS — L97521 Non-pressure chronic ulcer of other part of left foot limited to breakdown of skin: Secondary | ICD-10-CM

## 2019-09-06 NOTE — Progress Notes (Signed)
Office Visit Note   Patient: Bobby Fernandez           Date of Birth: 01/20/1961           MRN: [id number] Visit Date: 09/06/2019              Requested by: No referring provider defined for this encounter. PCP: Patient, No Pcp Per  Chief Complaint  Patient presents with   Left Leg - Pain      HPI: This is a pleasant 59 year old gentleman who follows up for his left lower extremity swelling.  He was seen and initially consulted by Dr. Ninfa Linden in the emergency room.  He was referred to our service for treatment of his lower extremity ulcer on his fourth toe as well as lower extremity swelling.  He was placed in a Dynaflex wrap last week.  The wrap has slipped down to about mid calf.  He does not have any complaints except that he has some ongoing pain in his left knee.  He is not sure if this is because of the swelling.  He did have a CT scan done when he was in the emergency room which included his left knee  Assessment & Plan: Visit Diagnoses: No diagnosis found.  Plan: Patient was under the impression he did see Dr. Sharol Given today he has not yet met Dr. Sharol Given.  I will have the patient be rewrapped today with follow-up with Dr. Sharol Given on Thursday.  An x-ray of his left knee should be obtained at that time  Follow-Up Instructions: No follow-ups on file.   Ortho Exam  Patient is alert, oriented, no adenopathy, well-dressed, normal affect, normal respiratory effort. Focused examination of the left lower extremity from the foot to mid calf he has minimal soft tissue swelling no cellulitis.  He has a small ulcer on his fourth toe there is no surrounding cellulitis or drainage.  From mid calf to his knee he has significantly more soft tissue swelling.  The knee does not demonstrate any cellulitis or any frank effusion he does have some tenderness around the joint line moderate soft tissue swelling no evidence of any cellulitis or infection  Imaging: No results found. No images are attached  to the encounter.  Labs: Lab Results  Component Value Date   HGBA1C 9.9 (H) 08/20/2019   REPTSTATUS 08/25/2019 FINAL 08/20/2019   CULT  08/20/2019    NO GROWTH 5 DAYS Performed at [facility], [place] [address], [place], [place] [address]      Lab Results  Component Value Date   ALBUMIN 3.4 (L) 08/21/2019    No results found for: MG No results found for: VD25OH  No results found for: PREALBUMIN CBC EXTENDED Latest Ref Rng & Units 08/22/2019 08/21/2019 08/20/2019  WBC 4.0 - 10.5 K/uL 4.5 4.3 6.0  RBC 4.22 - 5.81 MIL/uL 4.34 4.30 4.96  HGB 13.0 - 17.0 g/dL 11.5(L) 11.5(L) 13.1  HCT 39 - 52 % 35.4(L) 35.5(L) 40.9  PLT 150 - 400 K/uL 227 223 255  NEUTROABS 1.7 - 7.7 K/uL 2.7 - 4.0  LYMPHSABS 0.7 - 4.0 K/uL 1.1 - 1.2     There is no height or weight on file to calculate BMI.  Orders:  No orders of the defined types were placed in this encounter.  No orders of the defined types were placed in this encounter.    Procedures: No procedures performed  Clinical Data: No additional findings.  ROS:  All other  systems negative, except as noted in the HPI. Review of Systems  Objective: Vital Signs: There were no vitals taken for this visit.  Specialty Comments:  No specialty comments available.  PMFS History: Patient Active Problem List   Diagnosis Date Noted   Toe ulcer, left, limited to breakdown of skin ([facility]) 08/21/2019   Toe fracture, left 08/21/2019   DM hyperosmolarity type II, uncontrolled ([facility]) 08/21/2019   Left leg cellulitis 08/20/2019   No past medical history on file.  No family history on file.  No past surgical history on file. Social History   Occupational History   Not on file  Tobacco Use   Smoking status: Former Smoker   Smokeless tobacco: Never Used  Substance and Sexual Activity   Alcohol use: Not on file   Drug use: Not on file   Sexual activity: Not on file

## 2019-09-09 ENCOUNTER — Encounter: Payer: Self-pay | Admitting: Orthopedic Surgery

## 2019-09-09 ENCOUNTER — Ambulatory Visit (INDEPENDENT_AMBULATORY_CARE_PROVIDER_SITE_OTHER): Payer: Managed Care, Other (non HMO) | Admitting: Orthopedic Surgery

## 2019-09-09 ENCOUNTER — Ambulatory Visit: Payer: Self-pay

## 2019-09-09 VITALS — Ht 71.0 in | Wt 215.0 lb

## 2019-09-09 DIAGNOSIS — G8929 Other chronic pain: Secondary | ICD-10-CM | POA: Diagnosis not present

## 2019-09-09 DIAGNOSIS — M25562 Pain in left knee: Secondary | ICD-10-CM | POA: Diagnosis not present

## 2019-09-09 NOTE — Progress Notes (Signed)
Office Visit Note   Patient: Bobby Fernandez           Date of Birth: 05-13-1960           MRN: [id number] Visit Date: 09/09/2019              Requested by: No referring provider defined for this encounter. PCP: Bobby Fernandez  Chief Complaint  Patient presents with  . Left Leg - Follow-up    Unna profore dressing   . Left Knee - Pain      HPI: Patient is a 59 year old gentleman who presents in follow-up for left lower extremity swelling.  He states that the compression wrap has significantly decreased the swelling.  Patient also complains of pain in the popliteal fossa of the left knee.  Assessment & Plan: Visit Diagnoses:  1. Chronic pain of left knee     Plan: Recommended a size large compression stocking he will wear these daily start working on strengthening and exercise if he has persistent knee pain we would need to follow-up and possible get an MRI scan.  Follow-Up Instructions: Return if symptoms worsen or fail to improve.   Ortho Exam  Patient is alert, oriented, no adenopathy, well-dressed, normal affect, normal respiratory effort. Examination patient has excellent improvement of the venous swelling of the left lower extremity the skin wrinkles well there is no ulcers no cellulitis no signs of infection the calf is soft nontender he does have some tenderness to palpation of the popliteal fossa.  There is no crepitation with range of motion collaterals and cruciates are stable the medial and lateral joint line are nontender to palpation his only pain to palpation is in the popliteal fossa.  He has a strong dorsalis pedis pulse.  The calf is 38 cm in circumference.  Imaging: XR Knee 1-2 Views Left  Result Date: 09/09/2019 2 view radiographs of the left knee shows valgus alignment maintenance of the joint space with lateral calcification to the lateral femoral condyle either an extruded meniscus or bony changes to the lateral femoral condyle.  No images are  attached to the encounter.  Labs: Lab Results  Component Value Date   HGBA1C 9.9 (H) 08/20/2019   REPTSTATUS 08/25/2019 FINAL 08/20/2019   CULT  08/20/2019    NO GROWTH 5 DAYS Performed at [facility], [address], [place], [place] [address]      Lab Results  Component Value Date   ALBUMIN 3.4 (L) 08/21/2019    No results found for: MG No results found for: VD25OH  No results found for: PREALBUMIN CBC EXTENDED Latest Ref Rng & Units 08/22/2019 08/21/2019 08/20/2019  WBC 4.0 - 10.5 K/uL 4.5 4.3 6.0  RBC 4.22 - 5.81 MIL/uL 4.34 4.30 4.96  HGB 13.0 - 17.0 g/dL 11.5(L) 11.5(L) 13.1  HCT 39 - 52 % 35.4(L) 35.5(L) 40.9  PLT 150 - 400 K/uL 227 223 255  NEUTROABS 1.7 - 7.7 K/uL 2.7 - 4.0  LYMPHSABS 0.7 - 4.0 K/uL 1.1 - 1.2     Body mass index is 29.99 kg/m.  Orders:  Orders Placed This Encounter  Procedures  . XR Knee 1-2 Views Left   No orders of the defined types were placed in this encounter.    Procedures: No procedures performed  Clinical Data: No additional findings.  ROS:  All other systems negative, except as noted in the HPI. Review of Systems  Objective: Vital Signs: Ht 5\' 11"  (1.803 m)   Wt  215 lb (97.5 kg)   BMI 29.99 kg/m   Specialty Comments:  No specialty comments available.  PMFS History: Patient Active Problem List   Diagnosis Date Noted  . Toe ulcer, left, limited to breakdown of skin (HCC) 08/21/2019  . Toe fracture, left 08/21/2019  . DM hyperosmolarity type II, uncontrolled (HCC) 08/21/2019  . Left leg cellulitis 08/20/2019   History reviewed. No pertinent past medical history.  History reviewed. No pertinent family history.  History reviewed. No pertinent surgical history. Social History   Occupational History  . Not on file  Tobacco Use  . Smoking status: Former Games developer  . Smokeless tobacco: Never Used  Substance and Sexual Activity  . Alcohol use: Not on file  . Drug use: Not on file  . Sexual activity: Not on  file

## 2019-09-16 ENCOUNTER — Ambulatory Visit: Payer: 59 | Admitting: Orthopedic Surgery

## 2019-11-30 ENCOUNTER — Other Ambulatory Visit: Payer: Self-pay

## 2019-11-30 ENCOUNTER — Ambulatory Visit (INDEPENDENT_AMBULATORY_CARE_PROVIDER_SITE_OTHER): Payer: 59

## 2019-11-30 ENCOUNTER — Encounter: Payer: Self-pay | Admitting: Family

## 2019-11-30 ENCOUNTER — Ambulatory Visit (INDEPENDENT_AMBULATORY_CARE_PROVIDER_SITE_OTHER): Payer: 59 | Admitting: Family

## 2019-11-30 VITALS — Ht 71.0 in | Wt 215.0 lb

## 2019-11-30 DIAGNOSIS — R200 Anesthesia of skin: Secondary | ICD-10-CM | POA: Diagnosis not present

## 2019-11-30 NOTE — Progress Notes (Signed)
Office Visit Note   Patient: Bobby Fernandez           Date of Birth: Sep 12, 1960           MRN: [id number] Visit Date: 11/30/2019              Requested by: No referring provider defined for this encounter. PCP: Patient, No Pcp Per  Chief Complaint  Patient presents with   Left Knee - Pain      HPI: The patient is a 59 year old gentleman who presents today complaining of left knee pain.  States "something is very wrong with my knee."  He had followed in our clinic back in the summer for cellulitis with significant edema to his left lower extremity had some compression wrapping.  Has not had any injury over the summer or currently but continues to complain of worsening malalignment of his knee feels it is going out to the side he previously walked with a cane and now is having to walk with a walker rolling walker for stability denies any mechanical symptoms of his knee but overall feels he cannot rely on it.  Denies significant knee pain however relates sometime over the summer he has began having numbness from the knee down he is discussed this with his primary care provider.  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 Did not seek treatment at time.  Assessment & Plan: Visit Diagnoses:  1. Lower extremity numbness     Plan: will proceed with MRI of left knee evaluate for meniscal derangement as well as instability. Will MRI lumbar spine as well, ongoing radiculopathy of LLE.  Follow-Up Instructions: No follow-ups on file.   Left Knee Exam   Tenderness  Left knee tenderness location: states is numb.  Tests  Varus: negative   Other  Erythema:  absent Swelling: moderate  Comments:  Valgus laxity  Valgus deformity of knee   Back Exam   Tenderness  The patient is experiencing no tenderness.   Muscle Strength  The patient has normal back strength.  Other  Sensation: decreased (decreased at level of knee and distally) Gait: abnormal       Patient is alert, oriented, no adenopathy, well-dressed, normal affect, normal respiratory effort.   Imaging: No results found. No images are attached to the encounter.  Labs: Lab Results  Component Value Date   HGBA1C 9.9 (H) 08/20/2019   REPTSTATUS 08/25/2019 FINAL 08/20/2019   CULT  08/20/2019    NO GROWTH 5 DAYS Performed at [facility], [address], [place], [place] [address]      Lab Results  Component Value Date   ALBUMIN 3.4 (L) 08/21/2019    No results found for: MG No results found for: VD25OH  No results found for: PREALBUMIN CBC EXTENDED Latest Ref Rng & Units 08/22/2019 08/21/2019 08/20/2019  WBC 4.0 - 10.5 K/uL 4.5 4.3 6.0  RBC 4.22 - 5.81 MIL/uL 4.34 4.30 4.96  HGB 13.0 - 17.0 g/dL 11.5(L) 11.5(L) 13.1  HCT 39 - 52 % 35.4(L) 35.5(L) 40.9  PLT 150 - 400 K/uL 227 223 255  NEUTROABS 1.7 -  7.7 K/uL 2.7 - 4.0  LYMPHSABS 0.7 - 4.0 K/uL 1.1 - 1.2     Body mass index is 29.99 kg/m.  Orders:  Orders Placed This Encounter  Procedures   XR Lumbar Spine 2-3 Views   No orders of the defined types were placed in this encounter.    Procedures: No procedures performed  Clinical Data: No additional findings.  ROS:  All other systems negative, except as noted in the HPI. Review of Systems  Constitutional: Negative for chills and fever.  Cardiovascular: Positive for leg swelling.  Musculoskeletal: Positive for arthralgias, gait problem and joint swelling. Negative for back pain.  Skin: Negative for color change and wound.  Neurological: Positive for numbness. Negative for weakness.    Objective: Vital Signs: Ht 5\' 11"  (1.803 m)     Wt 215 lb (97.5 kg)    BMI 29.99 kg/m   Specialty Comments:  No specialty comments available.  PMFS History: Patient Active Problem List   Diagnosis Date Noted   Toe ulcer, left, limited to breakdown of skin (HCC) 08/21/2019   Toe fracture, left 08/21/2019   DM hyperosmolarity type II, uncontrolled (HCC) 08/21/2019   Left leg cellulitis 08/20/2019   No past medical history on file.  No family history on file.  No past surgical history on file. Social History   Occupational History   Not on file  Tobacco Use   Smoking status: Former Smoker   Smokeless tobacco: Never Used  Substance and Sexual Activity   Alcohol use: Not on file   Drug use: Not on file   Sexual activity: Not on file

## 2019-12-31 ENCOUNTER — Emergency Department (HOSPITAL_COMMUNITY)
Admission: EM | Admit: 2019-12-31 | Discharge: 2019-12-31 | Disposition: A | Discharge status: Home / Self Care | Payer: Managed Care, Other (non HMO) | Attending: Emergency Medicine | Admitting: Emergency Medicine

## 2019-12-31 ENCOUNTER — Encounter (HOSPITAL_COMMUNITY): Payer: Self-pay

## 2019-12-31 ENCOUNTER — Other Ambulatory Visit: Payer: Self-pay

## 2019-12-31 DIAGNOSIS — Y990 Civilian activity done for income or pay: Secondary | ICD-10-CM | POA: Diagnosis not present

## 2019-12-31 DIAGNOSIS — E119 Type 2 diabetes mellitus without complications: Secondary | ICD-10-CM | POA: Insufficient documentation

## 2019-12-31 DIAGNOSIS — Z794 Long term (current) use of insulin: Secondary | ICD-10-CM | POA: Insufficient documentation

## 2019-12-31 DIAGNOSIS — Y9289 Other specified places as the place of occurrence of the external cause: Secondary | ICD-10-CM | POA: Diagnosis not present

## 2019-12-31 DIAGNOSIS — X509XXA Other and unspecified overexertion or strenuous movements or postures, initial encounter: Secondary | ICD-10-CM | POA: Diagnosis not present

## 2019-12-31 DIAGNOSIS — Z87891 Personal history of nicotine dependence: Secondary | ICD-10-CM | POA: Diagnosis not present

## 2019-12-31 DIAGNOSIS — S6992XA Unspecified injury of left wrist, hand and finger(s), initial encounter: Secondary | ICD-10-CM | POA: Diagnosis present

## 2019-12-31 DIAGNOSIS — L03114 Cellulitis of left upper limb: Secondary | ICD-10-CM

## 2019-12-31 DIAGNOSIS — Z23 Encounter for immunization: Secondary | ICD-10-CM | POA: Diagnosis not present

## 2019-12-31 DIAGNOSIS — S60522A Blister (nonthermal) of left hand, initial encounter: Secondary | ICD-10-CM

## 2019-12-31 LAB — CBG MONITORING, ED: Glucose-Capillary: 174 mg/dL — ABNORMAL HIGH (ref 70–99)

## 2019-12-31 MED ORDER — PROBIOTIC 1-250 BILLION-MG PO CAPS: 1.0000 | ORAL_CAPSULE | Freq: Every day | ORAL | 0 refills | Status: AC

## 2019-12-31 MED ORDER — CLINDAMYCIN HCL 300 MG PO CAPS
300.0000 mg | ORAL_CAPSULE | Freq: Once | ORAL | Status: AC
  Administered 2019-12-31: 300 mg via ORAL
  Filled 2019-12-31: qty 1

## 2019-12-31 MED ORDER — TETANUS-DIPHTH-ACELL PERTUSSIS 5-2.5-18.5 LF-MCG/0.5 IM SUSY
0.5000 mL | PREFILLED_SYRINGE | Freq: Once | INTRAMUSCULAR | Status: AC
  Administered 2019-12-31: 0.5 mL via INTRAMUSCULAR
  Filled 2019-12-31: qty 0.5

## 2019-12-31 MED ORDER — CLINDAMYCIN HCL 300 MG PO CAPS
300.0000 mg | ORAL_CAPSULE | Freq: Four times a day (QID) | ORAL | 0 refills | Status: DC
Start: 2019-12-31 — End: 2021-09-25

## 2019-12-31 MED ORDER — BACITRACIN ZINC 500 UNIT/GM EX OINT
TOPICAL_OINTMENT | CUTANEOUS | Status: AC
  Filled 2019-12-31: qty 0.9

## 2019-12-31 MED ORDER — LIDOCAINE-EPINEPHRINE 2 %-1:100000 IJ SOLN
20.0000 mL | Freq: Once | INTRAMUSCULAR | Status: AC
  Administered 2019-12-31: 20 mL via INTRADERMAL
  Filled 2019-12-31: qty 1

## 2019-12-31 NOTE — ED Triage Notes (Signed)
Pt c/o left hand swelling and a large blister on palm of hand.

## 2019-12-31 NOTE — ED Provider Notes (Addendum)
TIME SEEN: 3:59 AM  CHIEF COMPLAINT: Left hand swelling  HPI: Patient is a 59 year old right-hand-dominant male with history of diabetes who presents to the emergency department with several days of left hand swelling and a blister to the palm of his hand.  No drainage or bleeding.  States he obtained the blister by pushing a cart where he works at [employer].  He has not had any fevers or vomiting.  Reports his blood sugars have been in the 150s.  ROS: See HPI Constitutional: no fever  Eyes: no drainage  ENT: no runny nose   Cardiovascular:  no chest pain  Resp: no SOB  GI: no vomiting GU: no dysuria Integumentary: no rash  Allergy: no hives  Musculoskeletal: no leg swelling  Neurological: no slurred speech ROS otherwise negative  PAST MEDICAL HISTORY/PAST SURGICAL HISTORY:  History reviewed. No pertinent past medical history.  MEDICATIONS:  Prior to Admission medications   Medication Sig Start Date End Date Taking? Authorizing Provider  blood glucose meter kit and supplies KIT Dispense based on patient and insurance preference. Use up to four times daily as directed. (FOR ICD-9 250.00, 250.01). Check Glucose once daily. Record values and take in when you visit your PCP. 08/22/19   Swayze, Ava, DO  insulin glargine (LANTUS) 100 UNIT/ML injection Inject 0.04 mLs (4 Units total) into the skin daily. 08/22/19   Swayze, Ava, DO  traMADol (ULTRAM) 50 MG tablet Take 1 tablet (50 mg total) by mouth every 6 (six) hours as needed for severe pain. 08/22/19   Swayze, Ava, DO    ALLERGIES:  No Known Allergies  SOCIAL HISTORY:  Social History   Tobacco Use  . Smoking status: Former Research scientist (life sciences)  . Smokeless tobacco: Never Used  Substance Use Topics  . Alcohol use: Not Currently    FAMILY HISTORY: No family history on file.  EXAM: BP (!) 169/86   Pulse (!) 113   Temp (!) 97.5 F (36.4 C) (Oral)   Resp 17   Ht '5\' 11"'  (1.803 m)   Wt 91.2 kg   SpO2 99%   BMI 28.03 kg/m  CONSTITUTIONAL:  Alert and oriented and responds appropriately to questions.  Chronically ill-appearing but in no distress, afebrile, nontoxic HEAD: Normocephalic EYES: Conjunctivae clear, pupils appear equal, EOM appear intact ENT: normal nose; moist mucous membranes NECK: Supple, normal ROM CARD: Regular and tachycardic; S1 and S2 appreciated; no murmurs, no clicks, no rubs, no gallops RESP: Normal chest excursion without splinting or tachypnea; breath sounds clear and equal bilaterally; no wheezes, no rhonchi, no rales, no hypoxia or respiratory distress, speaking full sentences ABD/GI: Normal bowel sounds; non-distended; soft, non-tender, no rebound, no guarding, no peritoneal signs, no hepatosplenomegaly BACK:  The back appears normal EXT: Normal ROM in all joints; no deformity noted, patient has soft tissue swelling to the palm and dorsal aspect of the left hand with a 2 x 3 cm fluctuant area at the thenar eminence of the left hand.  He has normal flexion and extension of the left fingers with no extending redness, warmth or tenderness.  2+ left radial and ulnar pulse.  Normal capillary refill.  He does have some surrounding redness and warmth to the palm of the left hand that extends to the ventral wrist.  No joint effusion.  Compartments in the left arm are soft. SKIN: Normal color for age and race; warm; no rash on exposed skin NEURO: Moves all extremities equally PSYCH: The patient's mood and manner are appropriate.  MEDICAL DECISION MAKING: Patient here what appears to be a superficial abscess versus blister to the left hand with some surrounding cellulitis.  He denies any systemic symptoms but he is a diabetic.  Will check blood sugar here.  Will give clindamycin and update patient's tetanus vaccination.  Discussed I&D at bedside which patient agrees to.  Will send wound culture.  I do not feel at this time he needs emergent hand surgery evaluation but will delayed outpatient follow-up.  ED PROGRESS:  After incision and drainage, patient drained moderate amount of serosanguineous fluid.  There was no purulent drainage.  More likely blister less likely abscess but we will have him continue clindamycin given there may be some component of hand cellulitis given hand is red, warm and swollen.  No sign of flexor tenosynovitis, septic arthritis, compartment syndrome, felon, herpetic whitlow.  Blood sugar here 174.  Discussed wound care instructions and return precautions.  At this time, I do not feel there is any life-threatening condition present. I have reviewed, interpreted and discussed all results (EKG, imaging, lab, urine as appropriate) and exam findings with patient/family. I have reviewed nursing notes and appropriate previous records.  I feel the patient is safe to be discharged home without further emergent workup and can continue workup as an outpatient as needed. Discussed usual and customary return precautions. Patient/family verbalize understanding and are comfortable with this plan.  Outpatient follow-up has been provided as needed. All questions have been answered.   INCISION AND DRAINAGE Performed by: Cyril Mourning Khalik Pewitt Consent: Verbal consent obtained. Risks and benefits: risks, benefits and alternatives were discussed Type: abscess  Body area: Left hand  Anesthesia: local infiltration  Small 0.5 cm stab incision was made with a scalpel.  Local anesthetic: lidocaine 2% with epinephrine  Anesthetic total: 3 ml  Complexity: complex Blunt dissection to break up loculations  Drainage: Serosanguineous  Drainage amount: Moderate  Packing material: None  Patient tolerance: Patient tolerated the procedure well with no immediate complications.     Bobby Fernandez was evaluated in Emergency Department on 12/31/2019 for the symptoms described in the history of present illness. He was evaluated in the context of the global COVID-19 pandemic, which necessitated consideration that the  patient might be at risk for infection with the SARS-CoV-2 virus that causes COVID-19. Institutional protocols and algorithms that pertain to the evaluation of patients at risk for COVID-19 are in a state of rapid change based on information released by regulatory bodies including the CDC and federal and state organizations. These policies and algorithms were followed during the patient's care in the ED.      Lahari Suttles, Delice Bison, DO 12/31/19 Pearl, Delice Bison, DO 12/31/19 [phone]

## 2019-12-31 NOTE — Discharge Instructions (Addendum)
You may alternate Tylenol 1000 mg every 6 hours as needed for pain, fever and Ibuprofen 800 mg every 8 hours as needed for pain, fever.  Please take Ibuprofen with food.  Do not take more than 4000 mg of Tylenol (acetaminophen) in a 24 hour period.  Please take your antibiotics until complete.  You may clean your hand with warm soap and water at least 2 times a day and keep this area covered with a clean bandage.  You may apply over-the-counter triple antibiotic ointment to the small opening that we made today.  If you notice that your hand is becoming more swollen/red or more painful, your blood sugars are elevated and you cannot get them under control, you have a fever of 100.4 or higher, vomiting and cannot keep down your medication, please return to the emergency department

## 2020-01-02 LAB — AEROBIC CULTURE W GRAM STAIN (SUPERFICIAL SPECIMEN): Gram Stain: NONE SEEN

## 2020-01-03 ENCOUNTER — Telehealth: Payer: Self-pay | Admitting: Emergency Medicine

## 2020-01-03 NOTE — Telephone Encounter (Signed)
Post ED Visit - Positive Culture Follow-up  Culture report reviewed by antimicrobial stewardship pharmacist: [facility] []  , Pharm.D. []  Enzo Bi, Pharm.D., BCPS AQ-ID []  , Pharm.D., BCPS []  Celedonio Miyamoto, .D., BCPS []  [place], .D., BCPS, AAHIVP []  Georgina Pillion, Pharm.D., BCPS, AAHIVP []  [address], PharmD, BCPS []  , PharmD, BCPS []  [place], PharmD, BCPS []  [address], PharmD []  , PharmD, BCPS []  Estella Husk, PharmD  Pharmacy Team [x]  Lysle Pearl, PharmD []  , PharmD []  Phillips Climes, PharmD []  , Rph []  Agapito Games) , PharmD []  Verlan Friends, PharmD []  , PharmD []  Mervyn Gay, PharmD []  , PharmD []  Vinnie Level, PharmD []  [facility], PharmD []  , PharmD []  Len Childs, PharmD   Positive wound culture Treated with clindamycin, organism sensitive to the same and no further patient follow-up is required at this time.  01/03/2020, 11:27 AM

## 2021-09-24 ENCOUNTER — Emergency Department (HOSPITAL_COMMUNITY): Payer: 59

## 2021-09-24 ENCOUNTER — Inpatient Hospital Stay (HOSPITAL_COMMUNITY)
Admission: EM | Admit: 2021-09-24 | Discharge: 2021-10-21 | DRG: 871 | Disposition: E | Payer: 59 | Attending: Pulmonary Disease | Admitting: Pulmonary Disease

## 2021-09-24 ENCOUNTER — Encounter (HOSPITAL_COMMUNITY): Payer: Self-pay

## 2021-09-24 ENCOUNTER — Inpatient Hospital Stay (HOSPITAL_COMMUNITY): Payer: 59

## 2021-09-24 ENCOUNTER — Other Ambulatory Visit: Payer: Self-pay

## 2021-09-24 DIAGNOSIS — I429 Cardiomyopathy, unspecified: Secondary | ICD-10-CM | POA: Diagnosis present

## 2021-09-24 DIAGNOSIS — W010XXA Fall on same level from slipping, tripping and stumbling without subsequent striking against object, initial encounter: Secondary | ICD-10-CM | POA: Diagnosis present

## 2021-09-24 DIAGNOSIS — R571 Hypovolemic shock: Secondary | ICD-10-CM | POA: Diagnosis not present

## 2021-09-24 DIAGNOSIS — D509 Iron deficiency anemia, unspecified: Secondary | ICD-10-CM | POA: Diagnosis present

## 2021-09-24 DIAGNOSIS — I5022 Chronic systolic (congestive) heart failure: Secondary | ICD-10-CM | POA: Diagnosis present

## 2021-09-24 DIAGNOSIS — Y92009 Unspecified place in unspecified non-institutional (private) residence as the place of occurrence of the external cause: Secondary | ICD-10-CM

## 2021-09-24 DIAGNOSIS — Z515 Encounter for palliative care: Secondary | ICD-10-CM

## 2021-09-24 DIAGNOSIS — D696 Thrombocytopenia, unspecified: Secondary | ICD-10-CM | POA: Diagnosis present

## 2021-09-24 DIAGNOSIS — N179 Acute kidney failure, unspecified: Secondary | ICD-10-CM | POA: Diagnosis present

## 2021-09-24 DIAGNOSIS — R6521 Severe sepsis with septic shock: Secondary | ICD-10-CM | POA: Diagnosis present

## 2021-09-24 DIAGNOSIS — B9620 Unspecified Escherichia coli [E. coli] as the cause of diseases classified elsewhere: Secondary | ICD-10-CM

## 2021-09-24 DIAGNOSIS — E785 Hyperlipidemia, unspecified: Secondary | ICD-10-CM

## 2021-09-24 DIAGNOSIS — A419 Sepsis, unspecified organism: Secondary | ICD-10-CM

## 2021-09-24 DIAGNOSIS — Z7984 Long term (current) use of oral hypoglycemic drugs: Secondary | ICD-10-CM

## 2021-09-24 DIAGNOSIS — N390 Urinary tract infection, site not specified: Secondary | ICD-10-CM | POA: Diagnosis present

## 2021-09-24 DIAGNOSIS — Z87891 Personal history of nicotine dependence: Secondary | ICD-10-CM | POA: Diagnosis not present

## 2021-09-24 DIAGNOSIS — E119 Type 2 diabetes mellitus without complications: Secondary | ICD-10-CM | POA: Diagnosis present

## 2021-09-24 DIAGNOSIS — R197 Diarrhea, unspecified: Secondary | ICD-10-CM | POA: Diagnosis present

## 2021-09-24 DIAGNOSIS — Z794 Long term (current) use of insulin: Secondary | ICD-10-CM | POA: Diagnosis not present

## 2021-09-24 DIAGNOSIS — Z1619 Resistance to other specified beta lactam antibiotics: Secondary | ICD-10-CM | POA: Diagnosis present

## 2021-09-24 DIAGNOSIS — R0603 Acute respiratory distress: Secondary | ICD-10-CM

## 2021-09-24 DIAGNOSIS — R778 Other specified abnormalities of plasma proteins: Secondary | ICD-10-CM

## 2021-09-24 DIAGNOSIS — A4151 Sepsis due to Escherichia coli [E. coli]: Secondary | ICD-10-CM | POA: Diagnosis present

## 2021-09-24 DIAGNOSIS — Y92002 Bathroom of unspecified non-institutional (private) residence single-family (private) house as the place of occurrence of the external cause: Secondary | ICD-10-CM | POA: Diagnosis not present

## 2021-09-24 DIAGNOSIS — E861 Hypovolemia: Secondary | ICD-10-CM | POA: Diagnosis present

## 2021-09-24 DIAGNOSIS — D649 Anemia, unspecified: Secondary | ICD-10-CM

## 2021-09-24 DIAGNOSIS — Z885 Allergy status to narcotic agent status: Secondary | ICD-10-CM | POA: Diagnosis not present

## 2021-09-24 DIAGNOSIS — W19XXXA Unspecified fall, initial encounter: Secondary | ICD-10-CM

## 2021-09-24 DIAGNOSIS — S0990XA Unspecified injury of head, initial encounter: Secondary | ICD-10-CM

## 2021-09-24 DIAGNOSIS — R7989 Other specified abnormal findings of blood chemistry: Secondary | ICD-10-CM | POA: Diagnosis present

## 2021-09-24 DIAGNOSIS — M6282 Rhabdomyolysis: Secondary | ICD-10-CM

## 2021-09-24 DIAGNOSIS — Z66 Do not resuscitate: Secondary | ICD-10-CM | POA: Diagnosis not present

## 2021-09-24 DIAGNOSIS — Z89612 Acquired absence of left leg above knee: Secondary | ICD-10-CM

## 2021-09-24 DIAGNOSIS — J9601 Acute respiratory failure with hypoxia: Secondary | ICD-10-CM | POA: Diagnosis not present

## 2021-09-24 DIAGNOSIS — I110 Hypertensive heart disease with heart failure: Secondary | ICD-10-CM | POA: Diagnosis present

## 2021-09-24 DIAGNOSIS — K8020 Calculus of gallbladder without cholecystitis without obstruction: Secondary | ICD-10-CM | POA: Diagnosis present

## 2021-09-24 DIAGNOSIS — E872 Acidosis, unspecified: Secondary | ICD-10-CM | POA: Diagnosis present

## 2021-09-24 DIAGNOSIS — I5020 Unspecified systolic (congestive) heart failure: Secondary | ICD-10-CM

## 2021-09-24 DIAGNOSIS — Z20822 Contact with and (suspected) exposure to covid-19: Secondary | ICD-10-CM | POA: Diagnosis present

## 2021-09-24 DIAGNOSIS — Z79899 Other long term (current) drug therapy: Secondary | ICD-10-CM

## 2021-09-24 LAB — COMPREHENSIVE METABOLIC PANEL
ALT: 23 U/L (ref 0–44)
AST: 99 U/L — ABNORMAL HIGH (ref 15–41)
Albumin: 2.8 g/dL — ABNORMAL LOW (ref 3.5–5.0)
Alkaline Phosphatase: 57 U/L (ref 38–126)
Anion gap: 13 (ref 5–15)
BUN: 66 mg/dL — ABNORMAL HIGH (ref 8–23)
CO2: 16 mmol/L — ABNORMAL LOW (ref 22–32)
Calcium: 7.9 mg/dL — ABNORMAL LOW (ref 8.9–10.3)
Chloride: 105 mmol/L (ref 98–111)
Creatinine, Ser: 3.93 mg/dL — ABNORMAL HIGH (ref 0.61–1.24)
GFR, Estimated: 16 mL/min — ABNORMAL LOW (ref >60)
Glucose, Bld: 141 mg/dL — ABNORMAL HIGH (ref 70–99)
Potassium: 4.4 mmol/L (ref 3.5–5.1)
Sodium: 134 mmol/L — ABNORMAL LOW (ref 135–145)
Total Bilirubin: 1 mg/dL (ref 0.3–1.2)
Total Protein: 6.2 g/dL — ABNORMAL LOW (ref 6.5–8.1)

## 2021-09-24 LAB — CBC WITH DIFFERENTIAL/PLATELET
Abs Immature Granulocytes: 0.54 10*3/uL — ABNORMAL HIGH (ref 0.00–0.07)
Basophils Absolute: 0 10*3/uL (ref 0.0–0.1)
Basophils Relative: 0 %
Eosinophils Absolute: 0 10*3/uL (ref 0.0–0.5)
Eosinophils Relative: 0 %
HCT: 21.6 % — ABNORMAL LOW (ref 39.0–52.0)
Hemoglobin: 7.1 g/dL — ABNORMAL LOW (ref 13.0–17.0)
Immature Granulocytes: 5 %
Lymphocytes Relative: 3 %
Lymphs Abs: 0.4 10*3/uL — ABNORMAL LOW (ref 0.7–4.0)
MCH: 25.1 pg — ABNORMAL LOW (ref 26.0–34.0)
MCHC: 32.9 g/dL (ref 30.0–36.0)
MCV: 76.3 fL — ABNORMAL LOW (ref 80.0–100.0)
Monocytes Absolute: 0.5 10*3/uL (ref 0.1–1.0)
Monocytes Relative: 5 %
Neutro Abs: 10.3 10*3/uL — ABNORMAL HIGH (ref 1.7–7.7)
Neutrophils Relative %: 87 %
Platelets: 117 10*3/uL — ABNORMAL LOW (ref 150–400)
RBC: 2.83 MIL/uL — ABNORMAL LOW (ref 4.22–5.81)
RDW: 17.8 % — ABNORMAL HIGH (ref 11.5–15.5)
WBC: 11.7 10*3/uL — ABNORMAL HIGH (ref 4.0–10.5)
nRBC: 0 % (ref 0.0–0.2)

## 2021-09-24 LAB — CK: Total CK: 7503 U/L — ABNORMAL HIGH (ref 49–397)

## 2021-09-24 LAB — HEMOGLOBIN A1C
Hgb A1c MFr Bld: 5.7 % — ABNORMAL HIGH (ref 4.8–5.6)
Mean Plasma Glucose: 116.89 mg/dL

## 2021-09-24 LAB — ABO/RH: ABO/RH(D): O POS

## 2021-09-24 LAB — URINALYSIS, ROUTINE W REFLEX MICROSCOPIC
Bilirubin Urine: NEGATIVE
Glucose, UA: NEGATIVE mg/dL
Ketones, ur: NEGATIVE mg/dL
Nitrite: NEGATIVE
Protein, ur: 100 mg/dL — AB
RBC / HPF: >50 RBC/hpf — ABNORMAL HIGH (ref 0–5)
Specific Gravity, Urine: 1.013 (ref 1.005–1.030)
WBC, UA: >50 WBC/hpf — ABNORMAL HIGH (ref 0–5)
pH: 5 (ref 5.0–8.0)

## 2021-09-24 LAB — TROPONIN I (HIGH SENSITIVITY)
Troponin I (High Sensitivity): 104 ng/L (ref <18)
Troponin I (High Sensitivity): 99 ng/L — ABNORMAL HIGH (ref <18)
Troponin I (High Sensitivity): 113 ng/L (ref <18)
Troponin I (High Sensitivity): 102 ng/L (ref <18)

## 2021-09-24 LAB — FOLATE: Folate: 26.4 ng/mL (ref >5.9)

## 2021-09-24 LAB — IRON AND TIBC
Iron: 11 ug/dL — ABNORMAL LOW (ref 45–182)
Saturation Ratios: 4 % — ABNORMAL LOW (ref 17.9–39.5)
TIBC: 257 ug/dL (ref 250–450)
UIBC: 246 ug/dL

## 2021-09-24 LAB — RESP PANEL BY RT-PCR (FLU A&B, COVID) ARPGX2
Influenza A by PCR: NEGATIVE
Influenza B by PCR: NEGATIVE
SARS Coronavirus 2 by RT PCR: NEGATIVE

## 2021-09-24 LAB — GLUCOSE, CAPILLARY: Glucose-Capillary: 102 mg/dL — ABNORMAL HIGH (ref 70–99)

## 2021-09-24 LAB — POC OCCULT BLOOD, ED: Fecal Occult Bld: POSITIVE — AB

## 2021-09-24 LAB — RETICULOCYTES
Immature Retic Fract: 6.4 % (ref 2.3–15.9)
RBC.: 2.67 MIL/uL — ABNORMAL LOW (ref 4.22–5.81)
Retic Count, Absolute: 29.9 10*3/uL (ref 19.0–186.0)
Retic Ct Pct: 1.1 % (ref 0.4–3.1)

## 2021-09-24 LAB — HEMOGLOBIN AND HEMATOCRIT, BLOOD
HCT: 23.8 % — ABNORMAL LOW (ref 39.0–52.0)
Hemoglobin: 7.6 g/dL — ABNORMAL LOW (ref 13.0–17.0)

## 2021-09-24 LAB — LACTIC ACID, PLASMA: Lactic Acid, Venous: 1.4 mmol/L (ref 0.5–1.9)

## 2021-09-24 LAB — VITAMIN B12: Vitamin B-12: 256 pg/mL (ref 180–914)

## 2021-09-24 LAB — FERRITIN: Ferritin: 144 ng/mL (ref 24–336)

## 2021-09-24 LAB — LIPASE, BLOOD: Lipase: 17 U/L (ref 11–51)

## 2021-09-24 MED ORDER — INSULIN ASPART 100 UNIT/ML IJ SOLN: 0.0000 [IU] | Freq: Every day | INTRAMUSCULAR | Status: DC

## 2021-09-24 MED ORDER — SODIUM CHLORIDE 0.9 % IV BOLUS
1000.0000 mL | Freq: Once | INTRAVENOUS | Status: AC
  Administered 2021-09-24: 1000 mL via INTRAVENOUS

## 2021-09-24 MED ORDER — PROCHLORPERAZINE EDISYLATE 10 MG/2ML IJ SOLN: 10.0000 mg | Freq: Four times a day (QID) | INTRAMUSCULAR | Status: DC | PRN

## 2021-09-24 MED ORDER — LEVALBUTEROL HCL 0.63 MG/3ML IN NEBU
0.6300 mg | INHALATION_SOLUTION | Freq: Four times a day (QID) | RESPIRATORY_TRACT | Status: DC
  Administered 2021-09-25: 0.63 mg via RESPIRATORY_TRACT
  Filled 2021-09-24 (×2): qty 3

## 2021-09-24 MED ORDER — LEVALBUTEROL HCL 0.63 MG/3ML IN NEBU
0.6300 mg | INHALATION_SOLUTION | Freq: Four times a day (QID) | RESPIRATORY_TRACT | Status: DC | PRN
  Administered 2021-09-24: 0.63 mg via RESPIRATORY_TRACT
  Filled 2021-09-24: qty 3

## 2021-09-24 MED ORDER — ACETAMINOPHEN 650 MG RE SUPP
650.0000 mg | Freq: Four times a day (QID) | RECTAL | Status: DC | PRN
  Administered 2021-09-25: 650 mg via RECTAL
  Filled 2021-09-24 (×2): qty 1

## 2021-09-24 MED ORDER — ACETAMINOPHEN 325 MG PO TABS
650.0000 mg | ORAL_TABLET | Freq: Four times a day (QID) | ORAL | Status: DC | PRN
  Administered 2021-09-24: 650 mg via ORAL
  Filled 2021-09-24: qty 2

## 2021-09-24 MED ORDER — IBUPROFEN 200 MG PO TABS: 200.0000 mg | ORAL_TABLET | Freq: Four times a day (QID) | ORAL | Status: DC | PRN

## 2021-09-24 MED ORDER — SODIUM CHLORIDE 0.9 % IV BOLUS
500.0000 mL | Freq: Once | INTRAVENOUS | Status: AC
  Administered 2021-09-24: 500 mL via INTRAVENOUS

## 2021-09-24 MED ORDER — PANTOPRAZOLE SODIUM 40 MG IV SOLR
40.0000 mg | Freq: Two times a day (BID) | INTRAVENOUS | Status: DC
  Administered 2021-09-24 – 2021-09-25 (×3): 40 mg via INTRAVENOUS
  Filled 2021-09-24 (×3): qty 10

## 2021-09-24 MED ORDER — LACTATED RINGERS IV SOLN
INTRAVENOUS | Status: DC
Start: 2021-09-24 — End: 2021-09-25

## 2021-09-24 MED ORDER — INSULIN ASPART 100 UNIT/ML IJ SOLN: 0.0000 [IU] | Freq: Three times a day (TID) | INTRAMUSCULAR | Status: DC

## 2021-09-24 NOTE — Progress Notes (Signed)
   10/12/21 2023  Provider Notification  Provider Name/Title Chinita Greenland, NP  Date Provider Notified Oct 12, 2021  Time Provider Notified 2001  Method of Notification Page  Notification Reason Critical result  Test performed and critical result Troponin = 113  Date Critical Result Received 10-12-21  Time Critical Result Received 1958  Provider response See new orders  Date of Provider Response October 12, 2021  Time of Provider Response 2013

## 2021-09-24 NOTE — ED Notes (Signed)
Pt transported to Xray/CT 

## 2021-09-24 NOTE — ED Triage Notes (Addendum)
Pt BIB EMS from home. Pt report he fell last night and has been on the floor since midnight. Family found him this morning. Left AKA. Pt was vomiting yesterday evening. Pt normally uses wheelchair and walker and was transferring to wheelchair when he fell. Pt endorse left leg pain and lower back pain. Denies hitting his head. Denies LOC. Denies taking blood thinners. Family reports he seems disoriented since fall. A&O x4 at this time.   BP 100/60 to 114/60 NS 20G LFA HR 120 CBG 171

## 2021-09-24 NOTE — Progress Notes (Signed)
   09-25-2021 1840  Assess: MEWS Score  Temp 98.5 F (36.9 C)  BP (!) 124/92  MAP (mmHg) 102  Pulse Rate (!) 125  Resp (!) 25  Level of Consciousness Alert  SpO2 95 %  O2 Device Room Air  Assess: MEWS Score  MEWS Temp 0  MEWS Systolic 0  MEWS Pulse 2  MEWS RR 1  MEWS LOC 0  MEWS Score 3  MEWS Score Color Yellow  Assess: if the MEWS score is Yellow or Red  Were vital signs taken at a resting state? Yes  Focused Assessment No change from prior assessment  Does the patient meet 2 or more of the SIRS criteria? Yes  Does the patient have a confirmed or suspected source of infection? No  MEWS guidelines implemented *See Row Information* Yes  Treat  MEWS Interventions Administered prn meds/treatments  Pain Scale 0-10  Pain Score 0  Take Vital Signs  Increase Vital Sign Frequency  Yellow: Q 2hr X 2 then Q 4hr X 2, if remains yellow, continue Q 4hrs  Escalate  MEWS: Escalate Yellow: discuss with charge nurse/RN and consider discussing with provider and RRT  Notify: Charge Nurse/RN  Name of Charge Nurse/RN Notified Lauren RN  Date Charge Nurse/RN Notified 25-Sep-2021  Time Charge Nurse/RN Notified 2043  Assess: SIRS CRITERIA  SIRS Temperature  0  SIRS Pulse 1  SIRS Respirations  1  SIRS WBC 1  SIRS Score Sum  3

## 2021-09-24 NOTE — H&P (Signed)
History and Physical    Patient: Bobby Fernandez HUT:[id number] DOB: [birth year] DOA: 10/05/2021 DOS: the patient was seen and examined on 10/16/2021 PCP: [facility]  Patient coming from: Home  Chief Complaint:  Chief Complaint  Patient presents with   Fall   Altered Mental Status   HPI: Bobby Fernandez is a 61 y.o. male with medical history significant of HFrEF, HLD, DM2. Presenting with a fall. He reports that he spent a significant amount of time vomiting yesterday. He had at least 5 episodes. It was sudden onset. He was initially unable to keep anything down, but the symptoms improved as the evening came. However, last night around 0030hrs, he had a fall as he was moving through his house. He slipped. There was no chest pain or palpitation. He was not dizzy. He denies hitting his head. He denies LOC. He remembers the entire fall. He could not get up on his own because he felt weak. So he spent the night on the floor. It was 13 hours later when his brother found him and assisted him up. He decided to bring him to the ED because he did not seem himself. He denies any other aggravating or alleviating factors.   Review of Systems: As mentioned in the history of present illness. All other systems reviewed and are negative.  PMHx DM2 HLD HFrEF Iron deficiency anemia  PSHx L AKA L knee replacement  Social History:  reports that he has quit smoking. He has never used smokeless tobacco. He reports that he does not currently use alcohol. He reports that he does not currently use drugs.  Allergies  Allergen Reactions   Codeine Nausea And Vomiting   Fam Hx History reviewed. No pertinent family history.  Prior to Admission medications   Medication Sig Start Date End Date Taking? Authorizing Provider  Bacillus Coagulans-Inulin (PROBIOTIC) 1-250 BILLION-MG CAPS Take 1 capsule by mouth daily. 12/31/19   Ward, Delice Bison, DO  blood glucose meter kit and supplies  KIT Dispense based on patient and insurance preference. Use up to four times daily as directed. (FOR ICD-9 250.00, 250.01). Check Glucose once daily. Record values and take in when you visit your PCP. 08/22/19   Swayze, Ava, DO  carvedilol (COREG) 6.25 MG tablet Take 6.25 mg by mouth 2 (two) times daily. 08/03/21   [provider]  clindamycin (CLEOCIN) 300 MG capsule Take 1 capsule (300 mg total) by mouth 4 (four) times daily. 12/31/19   Ward, Delice Bison, DO  insulin glargine (LANTUS) 100 UNIT/ML injection Inject 0.04 mLs (4 Units total) into the skin daily. 08/22/19   Swayze, Ava, DO  metFORMIN (GLUCOPHAGE-XR) 500 MG 24 hr tablet Take 500 mg by mouth daily. 09/02/21   [provider]  pravastatin (PRAVACHOL) 20 MG tablet Take 20 mg by mouth daily. 08/21/21   [provider]  traMADol (ULTRAM) 50 MG tablet Take 1 tablet (50 mg total) by mouth every 6 (six) hours as needed for severe pain. 08/22/19   Swayze, Ava, DO    Physical Exam: Vitals:   10/14/2021 1421 10/10/2021 1430 09/23/2021 1520 10/02/2021 1555  BP:  (!) 101/48 (!) 110/49 (!) 93/46  Pulse:  (!) 118 (!) 117 (!) 114  Resp:  (!) 27 (!) 27 (!) 31  Temp: 98.1 F (36.7 C)     TempSrc: Oral     SpO2:  97% 100% 95%   General: 61 y.o. male resting in bed in NAD Eyes: PERRL, normal sclera  ENMT: Nares patent w/o discharge, orophaynx clear, dentition normal, ears w/o discharge/lesions/ulcers Neck: Supple, trachea midline Cardiovascular: RRR, +S1, S2, no m/g/r, equal pulses throughout Respiratory: CTABL, no w/r/r, normal WOB GI: BS+, NDNT, no masses noted, no organomegaly noted MSK: No e/c/c; L AKA Neuro: A&O x 3, no focal deficits Psyc: Appropriate interaction and affect, calm/cooperative  Data Reviewed:  Lab Results  Component Value Date   NA 134 (L) 10/17/2021   K 4.4 10/14/2021   CO2 16 (L) 10/04/2021   GLUCOSE 141 (H) 10/04/2021   BUN 66 (H) 10/13/2021   CREATININE 3.93 (H) 10/20/2021   CALCIUM 7.9 (L) 09/25/2021    GFRNONAA 16 (L) 09/22/2021   Lab Results  Component Value Date   WBC 11.7 (H) 10/05/2021   HGB 7.1 (L) 09/21/2021   HCT 21.6 (L) 09/21/2021   MCV 76.3 (L) 10/02/2021   PLT 117 (L) 10/04/2021   CTH 1. No acute intracranial abnormalities.  CT c-spine 1. No fracture or acute finding.  CXR No active disease.  XR left femur 1. No acute fracture.  No dislocation.  XR pelvis No acute fracture or dislocation.  EKG: sinus tach, no st elevation  Assessment and Plan: Symptomatic anemia Thrombocytopenia     - admit to inpt, tele     - trend H&H q6h, transfuse for Hgb < 7     - protonix     - [facility] consulted, they will see him, appreciate assistance  AKI     - check renal US     - watch nephrotoxins     - fluids  Rhabdomyolysis     - fluids     - trend CPK  Fall     - Films negative for fracture     - PT eval  Elevated trp     - demand ischemia?     - EKG as above, no chest pain     - trend for now  Elevated LFTs     - secondary to rhabdo? Check RUQ ab [place] and hep panel  DM2     - will keep him on FLD until GI sees him     - A1c, SSI, glucose checks  HLD     - continue home regimen when confirmed  Chronic HFrEF     - hold diuretics     - I&O, daily weights  Advance Care Planning:   Code Status: DNR  Consults: [facility]  Family Communication: None at bedside  Severity of Illness: The appropriate patient status for this patient is INPATIENT. Inpatient status is judged to be reasonable and necessary in order to provide the required intensity of service to ensure the patient's safety. The patient's presenting symptoms, physical exam findings, and initial radiographic and laboratory data in the context of their chronic comorbidities is felt to place them at high risk for further clinical deterioration. Furthermore, it is not anticipated that the patient will be medically stable for discharge from the hospital within 2 midnights of admission.   * I certify  that at the point of admission it is my clinical judgment that the patient will require inpatient hospital care spanning beyond 2 midnights from the point of admission due to high intensity of service, high risk for further deterioration and high frequency of surveillance required.*  Time spent in coordination of this H&P: 45 minutes   Author: Jonnie Finner, DO 09/21/2021 4:20 PM  For on call review [email or web address].

## 2021-09-24 NOTE — ED Provider Notes (Signed)
Emergency Department Provider Note   I have reviewed the triage vital signs and the nursing notes.   HISTORY  Chief Complaint Fall and Altered Mental Status   HPI Bobby Fernandez is a 61 y.o. male with past history of diabetes and left AKA presents to the emergency department for evaluation after fall last night.  He was apparently down on the ground for approximately 13 hours at which point his brother showed up and was able to help him up off the ground.  He states that there was a water bottle nearby as he was able to drink fluids but has not eaten.  He denies any presyncope prior to fall.  He states he was using his walker to get to the bathroom and slipped on the floor causing him to fall.  He believes he did hit his head.  Denies any bleeding.  No change to medications.  According to family report to EMS the patient seemed different from his baseline but patient describes feeling ok overall.  She notes that he did have some nonbloody emesis yesterday but suspected that may have been from some food poisoning.  History reviewed. No pertinent past medical history.  Review of Systems  Constitutional: No fever/chills Eyes: No visual changes. ENT: No sore throat. Cardiovascular: Denies chest pain. Respiratory: Denies shortness of breath. Gastrointestinal: No abdominal pain.  Positive vomiting.  No diarrhea.  No constipation. Genitourinary: Negative for dysuria. Musculoskeletal: Negative for back pain. Skin: Negative for rash. Neurological: Negative for focal weakness or numbness. Positive HA.   ____________________________________________   PHYSICAL EXAM:  VITAL SIGNS: ED Triage Vitals  Enc Vitals Group     BP 10/09/2021 1420 (!) 111/51     Pulse Rate 10/08/2021 1420 (!) 120     Resp 10/12/2021 1420 (!) 29     Temp 09/23/2021 1421 98.1 F (36.7 C)     Temp Source 09/23/2021 1421 Oral     SpO2 10/09/2021 1420 98 %   Constitutional: Alert and oriented x 4. Well appearing and in no  acute distress. Eyes: Conjunctivae are normal.  Head: Atraumatic. Nose: No congestion/rhinnorhea. Mouth/Throat: Mucous membranes are moist.   Neck: No stridor.   Cardiovascular: Tachycardia. Good peripheral circulation. Grossly normal heart sounds.   Respiratory: Normal respiratory effort.  No retractions. Lungs CTAB. Gastrointestinal: Soft and nontender. No distention.  Musculoskeletal: Well appearing left AKA.  Neurologic:  Normal speech and language. No gross focal neurologic deficits are appreciated.  Skin:  Skin is warm, dry and intact. No rash noted.  ____________________________________________   LABS (all labs ordered are listed, but only abnormal results are displayed)  Labs Reviewed  URINE CULTURE - Abnormal; Notable for the following components:      Result Value   Culture >=100,000 COLONIES/mL ESCHERICHIA COLI (*)    Organism ID, Bacteria ESCHERICHIA COLI (*)    All other components within normal limits  CULTURE, BLOOD (ROUTINE X 2) - Abnormal; Notable for the following components:   Culture   (*)    Value: ESCHERICHIA COLI SUSCEPTIBILITIES TO FOLLOW Performed at [facility], [address] [place], [place] [address]    All other components within normal limits  CULTURE, BLOOD (ROUTINE X 2) - Abnormal; Notable for the following components:   Culture ESCHERICHIA COLI (*)    All other components within normal limits  URINE CULTURE - Abnormal; Notable for the following components:   Culture   (*)    Value: >=100,000 COLONIES/mL GRAM NEGATIVE RODS IDENTIFICATION  AND SUSCEPTIBILITIES TO FOLLOW Performed at [facility], [address] [place], [place] [address]    All other components within normal limits  BLOOD CULTURE ID PANEL (REFLEXED) - BCID2 - Abnormal; Notable for the following components:   Enterobacterales DETECTED (*)    Escherichia coli DETECTED (*)    All other components within normal limits  COMPREHENSIVE METABOLIC PANEL - Abnormal;  Notable for the following components:   Sodium 134 (*)    CO2 16 (*)    Glucose, Bld 141 (*)    BUN 66 (*)    Creatinine, Ser 3.93 (*)    Calcium 7.9 (*)    Total Protein 6.2 (*)    Albumin 2.8 (*)    AST 99 (*)    GFR, Estimated 16 (*)    All other components within normal limits  CBC WITH DIFFERENTIAL/PLATELET - Abnormal; Notable for the following components:   WBC 11.7 (*)    RBC 2.83 (*)    Hemoglobin 7.1 (*)    HCT 21.6 (*)    MCV 76.3 (*)    MCH 25.1 (*)    RDW 17.8 (*)    Platelets 117 (*)    Neutro Abs 10.3 (*)    Lymphs Abs 0.4 (*)    Abs Immature Granulocytes 0.54 (*)    All other components within normal limits  CK - Abnormal; Notable for the following components:   Total CK 7,503 (*)    All other components within normal limits  URINALYSIS, ROUTINE W REFLEX MICROSCOPIC - Abnormal; Notable for the following components:   APPearance CLOUDY (*)    Hgb urine dipstick LARGE (*)    Protein, ur 100 (*)    Leukocytes,Ua LARGE (*)    RBC / HPF >50 (*)    WBC, UA >50 (*)    Bacteria, UA MANY (*)    All other components within normal limits  IRON AND TIBC - Abnormal; Notable for the following components:   Iron 11 (*)    Saturation Ratios 4 (*)    All other components within normal limits  RETICULOCYTES - Abnormal; Notable for the following components:   RBC. 2.67 (*)    All other components within normal limits  HEMOGLOBIN A1C - Abnormal; Notable for the following components:   Hgb A1c MFr Bld 5.7 (*)    All other components within normal limits  COMPREHENSIVE METABOLIC PANEL - Abnormal; Notable for the following components:   Sodium 129 (*)    CO2 13 (*)    BUN 70 (*)    Creatinine, Ser 4.04 (*)    Calcium 7.1 (*)    Total Protein 6.3 (*)    Albumin 2.9 (*)    AST 120 (*)    GFR, Estimated 16 (*)    All other components within normal limits  CBC - Abnormal; Notable for the following components:   RBC 2.79 (*)    Hemoglobin 7.0 (*)    HCT 21.4 (*)     MCV 76.7 (*)    MCH 25.1 (*)    RDW 18.1 (*)    Platelets 95 (*)    All other components within normal limits  HEMOGLOBIN AND HEMATOCRIT, BLOOD - Abnormal; Notable for the following components:   Hemoglobin 7.6 (*)    HCT 23.8 (*)    All other components within normal limits  CK - Abnormal; Notable for the following components:   Total CK 4,436 (*)    All other components  within normal limits  GLUCOSE, CAPILLARY - Abnormal; Notable for the following components:   Glucose-Capillary 102 (*)    All other components within normal limits  GLUCOSE, CAPILLARY - Abnormal; Notable for the following components:   Glucose-Capillary 42 (*)    All other components within normal limits  BASIC METABOLIC PANEL - Abnormal; Notable for the following components:   Sodium 129 (*)    CO2 10 (*)    BUN 77 (*)    Creatinine, Ser 4.64 (*)    Calcium 6.7 (*)    GFR, Estimated 13 (*)    All other components within normal limits  BLOOD GAS, ARTERIAL - Abnormal; Notable for the following components:   pH, Arterial 7.32 (*)    pCO2 arterial 19 (*)    pO2, Arterial 137 (*)    Bicarbonate 9.8 (*)    Acid-base deficit 14.8 (*)    All other components within normal limits  CBC - Abnormal; Notable for the following components:   RBC 2.33 (*)    Hemoglobin 5.8 (*)    HCT 18.9 (*)    MCH 24.9 (*)    RDW 18.4 (*)    Platelets 65 (*)    All other components within normal limits  CBC - Abnormal; Notable for the following components:   WBC 11.8 (*)    RBC 2.72 (*)    Hemoglobin 6.8 (*)    HCT 21.7 (*)    MCV 79.8 (*)    MCH 25.0 (*)    RDW 18.2 (*)    Platelets 73 (*)    All other components within normal limits  CBC - Abnormal; Notable for the following components:   RBC 3.42 (*)    Hemoglobin 8.9 (*)    HCT 25.5 (*)    MCV 74.6 (*)    RDW 18.9 (*)    Platelets 48 (*)    All other components within normal limits  BASIC METABOLIC PANEL - Abnormal; Notable for the following components:   Sodium  131 (*)    CO2 13 (*)    Glucose, Bld 120 (*)    BUN 77 (*)    Creatinine, Ser 4.41 (*)    Calcium 6.7 (*)    GFR, Estimated 14 (*)    All other components within normal limits  GLUCOSE, CAPILLARY - Abnormal; Notable for the following components:   Glucose-Capillary 101 (*)    All other components within normal limits  GLUCOSE, CAPILLARY - Abnormal; Notable for the following components:   Glucose-Capillary 113 (*)    All other components within normal limits  PROTIME-INR - Abnormal; Notable for the following components:   Prothrombin Time 16.0 (*)    INR 1.3 (*)    All other components within normal limits  CK - Abnormal; Notable for the following components:   Total CK 761 (*)    All other components within normal limits  COMPREHENSIVE METABOLIC PANEL - Abnormal; Notable for the following components:   CO2 17 (*)    BUN 86 (*)    Creatinine, Ser 4.77 (*)    Calcium 6.7 (*)    Total Protein 5.1 (*)    Albumin 2.2 (*)    AST 76 (*)    Total Bilirubin 1.6 (*)    GFR, Estimated 13 (*)    All other components within normal limits  PHOSPHORUS - Abnormal; Notable for the following components:   Phosphorus 6.4 (*)    All other components within normal limits  CBC - Abnormal; Notable for the following components:   WBC 23.9 (*)    RBC 3.33 (*)    Hemoglobin 8.6 (*)    HCT 26.2 (*)    MCV 78.7 (*)    MCH 25.8 (*)    RDW 18.7 (*)    Platelets 46 (*)    All other components within normal limits  BASIC METABOLIC PANEL - Abnormal; Notable for the following components:   CO2 18 (*)    Glucose, Bld 131 (*)    BUN 86 (*)    Creatinine, Ser 4.66 (*)    Calcium 7.1 (*)    GFR, Estimated 13 (*)    Anion gap 18 (*)    All other components within normal limits  LACTIC ACID, PLASMA - Abnormal; Notable for the following components:   Lactic Acid, Venous 3.7 (*)    All other components within normal limits  GLUCOSE, CAPILLARY - Abnormal; Notable for the following components:    Glucose-Capillary 106 (*)    All other components within normal limits  POC OCCULT BLOOD, ED - Abnormal; Notable for the following components:   Fecal Occult Bld POSITIVE (*)    All other components within normal limits  TROPONIN I (HIGH SENSITIVITY) - Abnormal; Notable for the following components:   Troponin I (High Sensitivity) 104 (*)    All other components within normal limits  TROPONIN I (HIGH SENSITIVITY) - Abnormal; Notable for the following components:   Troponin I (High Sensitivity) 99 (*)    All other components within normal limits  TROPONIN I (HIGH SENSITIVITY) - Abnormal; Notable for the following components:   Troponin I (High Sensitivity) 113 (*)    All other components within normal limits  TROPONIN I (HIGH SENSITIVITY) - Abnormal; Notable for the following components:   Troponin I (High Sensitivity) 102 (*)    All other components within normal limits  TROPONIN I (HIGH SENSITIVITY) - Abnormal; Notable for the following components:   Troponin I (High Sensitivity) 196 (*)    All other components within normal limits  TROPONIN I (HIGH SENSITIVITY) - Abnormal; Notable for the following components:   Troponin I (High Sensitivity) 217 (*)    All other components within normal limits  TROPONIN I (HIGH SENSITIVITY) - Abnormal; Notable for the following components:   Troponin I (High Sensitivity) 263 (*)    All other components within normal limits  RESP PANEL BY RT-PCR (FLU A&B, COVID) ARPGX2  MRSA NEXT GEN BY PCR, NASAL  LIPASE, BLOOD  LACTIC ACID, PLASMA  VITAMIN B12  FOLATE  FERRITIN  HEPATITIS PANEL, ACUTE  GLUCOSE, CAPILLARY  GLUCOSE, CAPILLARY  GLUCOSE, CAPILLARY  HAPTOGLOBIN  LACTATE DEHYDROGENASE  GLUCOSE, CAPILLARY  MAGNESIUM  GLUCOSE, CAPILLARY  GLUCOSE, CAPILLARY  TYPE AND SCREEN  ABO/RH  PREPARE RBC (CROSSMATCH)   ____________________________________________  EKG   EKG Interpretation  Date/Time:  Monday September 24 2021 14:19:11  EDT Ventricular Rate:  122 PR Interval:  118 QRS Duration: 108 QT Interval:  353 QTC Calculation: 499 R Axis:   23 Text Interpretation: Sinus tachycardia Atrial premature complex Abnormal R-wave progression, early transition Borderline T abnormalities, inferior leads Borderline prolonged QT interval Confirmed by Alona Bene [phone]) on 10/16/2021 2:59:25 PM        ____________________________________________   PROCEDURES  Procedure(s) performed:   Procedures  None  ____________________________________________   INITIAL IMPRESSION / ASSESSMENT AND PLAN / ED COURSE  Pertinent labs & imaging results that were available during my care of the patient  were reviewed by me and considered in my medical decision making (see chart for details).   This patient is Presenting for Evaluation of AMS, which does require a range of treatment options, and is a complaint that involves a high risk of morbidity and mortality.  The Differential Diagnoses includes but is not exclusive to alcohol, illicit or prescription medications, intracranial pathology such as stroke, intracerebral hemorrhage, fever or infectious causes including sepsis, hypoxemia, uremia, trauma, endocrine related disorders such as diabetes, hypoglycemia, thyroid-related diseases, etc.   Critical Interventions-    Medications  sterile water (preservative free) injection (has no administration in time range)  sodium chloride 0.9 % bolus 1,000 mL (0 mLs Intravenous Stopped 09/21/2021 1712)  sodium chloride 0.9 % bolus 500 mL (500 mLs Intravenous New Bag/Given 09/30/2021 2017)  furosemide (LASIX) injection 40 mg (40 mg Intravenous Given 09/25/21 0811)  dextrose 50 % solution 25 mL (25 mLs Intravenous Given 09/25/21 1150)  piperacillin-tazobactam (ZOSYN) IVPB 3.375 g (0 g Intravenous Stopped 09/25/21 1300)  sodium bicarbonate injection 50 mEq (50 mEq Intravenous Given 09/25/21 1220)  dextrose 50 % solution 50 mL (50 mLs Intravenous Given 09/25/21  1318)  sodium bicarbonate injection 50 mEq (50 mEq Intravenous Given 09/25/21 1343)  lactated ringers bolus 1,000 mL ( Intravenous Stopped 09/25/21 1449)  0.9 %  sodium chloride infusion (Manually program via Guardrails IV Fluids) (0 mLs Intravenous Stopped 09/25/21 1955)  sodium bicarbonate 1 mEq/mL injection (50 mEq  Given 09/25/21 1721)  calcium gluconate 2 g/ 100 mL sodium chloride IVPB (0 mg Intravenous Stopped 09/25/21 2100)  phentolamine (REGITINE) injection 5 mg (5 mg Subcutaneous Given October 08, 2021 0227)  fentaNYL (SUBLIMAZE) 100 MCG/2ML injection (  Given 10-08-21 0920)  sodium bicarbonate 1 mEq/mL injection (  Duplicate 2021-10-08 1120)  fentaNYL (SUBLIMAZE) injection 50 mcg (0 mcg Intravenous Duplicate 10-08-2021 1119)  amiodarone (NEXTERONE) 1.8 mg/mL load via infusion 150 mg (150 mg Intravenous Bolus from Bag 10/08/21 0946)    Reassessment after intervention: HR improved.    I did obtain Additional Historical Information from EMS.  I decided to review pertinent External Data, and in summary last admit was from 2021 with left lower extremity cellulitis.   Clinical Laboratory Tests Ordered, included acute kidney injury.  CK is pending.  UA also pending.  Globin down to 7.1 with no bright red blood or melena on exam.   Radiologic Tests Ordered, included CXR, pelvis x-ray, CT head, c spine. I independently interpreted the images and agree with radiology interpretation.   Cardiac Monitor Tracing which shows sinus tachycardia.    Social Determinants of Health Risk patient is a non-smoker.   Consult complete with Hospitalist. Plan for admit.   Medical Decision Making: Summary:  Patient presents emergency department with altered mental status after fall while being down on the ground for many hours.  Found to be in acute kidney injury here with persistent tachycardia.  Afebrile.  UA pending and hospitalist to follow this result.  Plan to continue IV fluids.  Mildly improved heart rate on my  reassessment.  Disposition: discharge  ____________________________________________  FINAL CLINICAL IMPRESSION(S) / ED DIAGNOSES  Final diagnoses:  AKI (acute kidney injury) (HCC)  Anemia, unspecified type  Fall, initial encounter  Injury of head, initial encounter    Note:  This document was prepared using Dragon voice recognition software and may include unintentional dictation errors.  Alona Bene, MD, [facility] Emergency Medicine    Ulysses Alper, Arlyss Repress, MD 09/27/21 [phone]

## 2021-09-25 ENCOUNTER — Encounter (HOSPITAL_COMMUNITY): Payer: Self-pay | Admitting: Internal Medicine

## 2021-09-25 ENCOUNTER — Inpatient Hospital Stay (HOSPITAL_COMMUNITY): Payer: 59

## 2021-09-25 DIAGNOSIS — R571 Hypovolemic shock: Secondary | ICD-10-CM | POA: Diagnosis not present

## 2021-09-25 DIAGNOSIS — R0603 Acute respiratory distress: Secondary | ICD-10-CM

## 2021-09-25 DIAGNOSIS — N179 Acute kidney failure, unspecified: Secondary | ICD-10-CM

## 2021-09-25 DIAGNOSIS — A419 Sepsis, unspecified organism: Secondary | ICD-10-CM

## 2021-09-25 DIAGNOSIS — E872 Acidosis, unspecified: Secondary | ICD-10-CM | POA: Diagnosis not present

## 2021-09-25 LAB — CBC
HCT: 21.4 % — ABNORMAL LOW (ref 39.0–52.0)
HCT: 18.9 % — ABNORMAL LOW (ref 39.0–52.0)
HCT: 21.7 % — ABNORMAL LOW (ref 39.0–52.0)
HCT: 25.5 % — ABNORMAL LOW (ref 39.0–52.0)
Hemoglobin: 7 g/dL — ABNORMAL LOW (ref 13.0–17.0)
Hemoglobin: 5.8 g/dL — CL (ref 13.0–17.0)
Hemoglobin: 6.8 g/dL — CL (ref 13.0–17.0)
Hemoglobin: 8.9 g/dL — ABNORMAL LOW (ref 13.0–17.0)
MCH: 25.1 pg — ABNORMAL LOW (ref 26.0–34.0)
MCH: 24.9 pg — ABNORMAL LOW (ref 26.0–34.0)
MCH: 25 pg — ABNORMAL LOW (ref 26.0–34.0)
MCH: 26 pg (ref 26.0–34.0)
MCHC: 32.7 g/dL (ref 30.0–36.0)
MCHC: 30.7 g/dL (ref 30.0–36.0)
MCHC: 31.3 g/dL (ref 30.0–36.0)
MCHC: 34.9 g/dL (ref 30.0–36.0)
MCV: 76.7 fL — ABNORMAL LOW (ref 80.0–100.0)
MCV: 81.1 fL (ref 80.0–100.0)
MCV: 79.8 fL — ABNORMAL LOW (ref 80.0–100.0)
MCV: 74.6 fL — ABNORMAL LOW (ref 80.0–100.0)
Platelets: 95 10*3/uL — ABNORMAL LOW (ref 150–400)
Platelets: 65 10*3/uL — ABNORMAL LOW (ref 150–400)
Platelets: 73 10*3/uL — ABNORMAL LOW (ref 150–400)
Platelets: 48 10*3/uL — ABNORMAL LOW (ref 150–400)
RBC: 2.79 MIL/uL — ABNORMAL LOW (ref 4.22–5.81)
RBC: 2.33 MIL/uL — ABNORMAL LOW (ref 4.22–5.81)
RBC: 2.72 MIL/uL — ABNORMAL LOW (ref 4.22–5.81)
RBC: 3.42 MIL/uL — ABNORMAL LOW (ref 4.22–5.81)
RDW: 18.1 % — ABNORMAL HIGH (ref 11.5–15.5)
RDW: 18.4 % — ABNORMAL HIGH (ref 11.5–15.5)
RDW: 18.2 % — ABNORMAL HIGH (ref 11.5–15.5)
RDW: 18.9 % — ABNORMAL HIGH (ref 11.5–15.5)
WBC: 8.6 10*3/uL (ref 4.0–10.5)
WBC: 9.7 10*3/uL (ref 4.0–10.5)
WBC: 11.8 10*3/uL — ABNORMAL HIGH (ref 4.0–10.5)
WBC: 5.6 10*3/uL (ref 4.0–10.5)
nRBC: 0 % (ref 0.0–0.2)
nRBC: 0 % (ref 0.0–0.2)
nRBC: 0 % (ref 0.0–0.2)
nRBC: 0 % (ref 0.0–0.2)

## 2021-09-25 LAB — BASIC METABOLIC PANEL
Anion gap: 15 (ref 5–15)
Anion gap: 15 (ref 5–15)
BUN: 77 mg/dL — ABNORMAL HIGH (ref 8–23)
BUN: 77 mg/dL — ABNORMAL HIGH (ref 8–23)
CO2: 10 mmol/L — ABNORMAL LOW (ref 22–32)
CO2: 13 mmol/L — ABNORMAL LOW (ref 22–32)
Calcium: 6.7 mg/dL — ABNORMAL LOW (ref 8.9–10.3)
Calcium: 6.7 mg/dL — ABNORMAL LOW (ref 8.9–10.3)
Chloride: 104 mmol/L (ref 98–111)
Chloride: 103 mmol/L (ref 98–111)
Creatinine, Ser: 4.64 mg/dL — ABNORMAL HIGH (ref 0.61–1.24)
Creatinine, Ser: 4.41 mg/dL — ABNORMAL HIGH (ref 0.61–1.24)
GFR, Estimated: 13 mL/min — ABNORMAL LOW (ref >60)
GFR, Estimated: 14 mL/min — ABNORMAL LOW (ref >60)
Glucose, Bld: 85 mg/dL (ref 70–99)
Glucose, Bld: 120 mg/dL — ABNORMAL HIGH (ref 70–99)
Potassium: 4.6 mmol/L (ref 3.5–5.1)
Potassium: 3.6 mmol/L (ref 3.5–5.1)
Sodium: 129 mmol/L — ABNORMAL LOW (ref 135–145)
Sodium: 131 mmol/L — ABNORMAL LOW (ref 135–145)

## 2021-09-25 LAB — ECHOCARDIOGRAM COMPLETE
AR max vel: 2.97 cm2
AV Area VTI: 3.03 cm2
AV Area mean vel: 2.97 cm2
AV Mean grad: 4 mmHg
AV Peak grad: 7 mmHg
Ao pk vel: 1.32 m/s
Calc EF: 46 %
Height: 71 in
MV M vel: 3.81 m/s
MV Peak grad: 58.1 mmHg
S' Lateral: 3.9 cm
Single Plane A2C EF: 42 %
Single Plane A4C EF: 47.2 %
Weight: 2684.32 oz

## 2021-09-25 LAB — BLOOD CULTURE ID PANEL (REFLEXED) - BCID2

## 2021-09-25 LAB — BLOOD GAS, ARTERIAL
Acid-base deficit: 14.8 mmol/L — ABNORMAL HIGH (ref 0.0–2.0)
Bicarbonate: 9.8 mmol/L — ABNORMAL LOW (ref 20.0–28.0)
Delivery systems: POSITIVE
Drawn by: 25770
Expiratory PAP: 5 cmH2O
FIO2: 30 %
Inspiratory PAP: 10 cmH2O
Mode: POSITIVE
O2 Saturation: 99.8 %
Patient temperature: 36.7
RATE: 16 resp/min
pCO2 arterial: 19 mmHg — CL (ref 32–48)
pH, Arterial: 7.32 — ABNORMAL LOW (ref 7.35–7.45)
pO2, Arterial: 137 mmHg — ABNORMAL HIGH (ref 83–108)

## 2021-09-25 LAB — GLUCOSE, CAPILLARY
Glucose-Capillary: 79 mg/dL (ref 70–99)
Glucose-Capillary: 113 mg/dL — ABNORMAL HIGH (ref 70–99)
Glucose-Capillary: 42 mg/dL — CL (ref 70–99)
Glucose-Capillary: 79 mg/dL (ref 70–99)
Glucose-Capillary: 78 mg/dL (ref 70–99)
Glucose-Capillary: 101 mg/dL — ABNORMAL HIGH (ref 70–99)
Glucose-Capillary: 95 mg/dL (ref 70–99)

## 2021-09-25 LAB — COMPREHENSIVE METABOLIC PANEL
ALT: 28 U/L (ref 0–44)
AST: 120 U/L — ABNORMAL HIGH (ref 15–41)
Albumin: 2.9 g/dL — ABNORMAL LOW (ref 3.5–5.0)
Alkaline Phosphatase: 67 U/L (ref 38–126)
Anion gap: 13 (ref 5–15)
BUN: 70 mg/dL — ABNORMAL HIGH (ref 8–23)
CO2: 13 mmol/L — ABNORMAL LOW (ref 22–32)
Calcium: 7.1 mg/dL — ABNORMAL LOW (ref 8.9–10.3)
Chloride: 103 mmol/L (ref 98–111)
Creatinine, Ser: 4.04 mg/dL — ABNORMAL HIGH (ref 0.61–1.24)
GFR, Estimated: 16 mL/min — ABNORMAL LOW (ref >60)
Glucose, Bld: 84 mg/dL (ref 70–99)
Potassium: 3.6 mmol/L (ref 3.5–5.1)
Sodium: 129 mmol/L — ABNORMAL LOW (ref 135–145)
Total Bilirubin: 1 mg/dL (ref 0.3–1.2)
Total Protein: 6.3 g/dL — ABNORMAL LOW (ref 6.5–8.1)

## 2021-09-25 LAB — LACTATE DEHYDROGENASE: LDH: 166 U/L (ref 98–192)

## 2021-09-25 LAB — HEPATITIS PANEL, ACUTE
HCV Ab: NONREACTIVE
Hep A IgM: NONREACTIVE
Hep B C IgM: NONREACTIVE
Hepatitis B Surface Ag: NONREACTIVE

## 2021-09-25 LAB — TROPONIN I (HIGH SENSITIVITY)
Troponin I (High Sensitivity): 196 ng/L (ref <18)
Troponin I (High Sensitivity): 217 ng/L (ref <18)
Troponin I (High Sensitivity): 263 ng/L (ref <18)

## 2021-09-25 LAB — MRSA NEXT GEN BY PCR, NASAL: MRSA by PCR Next Gen: NOT DETECTED

## 2021-09-25 LAB — PREPARE RBC (CROSSMATCH)

## 2021-09-25 LAB — CK: Total CK: 4436 U/L — ABNORMAL HIGH (ref 49–397)

## 2021-09-25 MED ORDER — LACTATED RINGERS IV BOLUS
1000.0000 mL | Freq: Once | INTRAVENOUS | Status: AC
  Administered 2021-09-25: 1000 mL via INTRAVENOUS

## 2021-09-25 MED ORDER — SODIUM BICARBONATE 8.4 % IV SOLN
INTRAVENOUS | Status: AC
  Administered 2021-09-25: 50 meq
  Filled 2021-09-25: qty 50

## 2021-09-25 MED ORDER — SODIUM CHLORIDE 0.9 % IV SOLN: 2.0000 g | INTRAVENOUS | Status: DC

## 2021-09-25 MED ORDER — CALCIUM GLUCONATE-NACL 2-0.675 GM/100ML-% IV SOLN
2.0000 g | Freq: Once | INTRAVENOUS | Status: AC
  Administered 2021-09-25: 2000 mg via INTRAVENOUS
  Filled 2021-09-25: qty 100

## 2021-09-25 MED ORDER — STERILE WATER FOR INJECTION IV SOLN
INTRAVENOUS | Status: DC
  Filled 2021-09-25 (×2): qty 150
  Filled 2021-09-25: qty 1000

## 2021-09-25 MED ORDER — HEPARIN SODIUM (PORCINE) 5000 UNIT/ML IJ SOLN: 5000.0000 [IU] | Freq: Three times a day (TID) | INTRAMUSCULAR | Status: DC

## 2021-09-25 MED ORDER — DEXTROSE 50 % IV SOLN
1.0000 | Freq: Once | INTRAVENOUS | Status: AC
  Administered 2021-09-25: 50 mL via INTRAVENOUS

## 2021-09-25 MED ORDER — CHLORHEXIDINE GLUCONATE CLOTH 2 % EX PADS
6.0000 | MEDICATED_PAD | Freq: Every day | CUTANEOUS | Status: DC
  Administered 2021-09-25: 6 via TOPICAL

## 2021-09-25 MED ORDER — FUROSEMIDE 10 MG/ML IJ SOLN
40.0000 mg | Freq: Once | INTRAMUSCULAR | Status: AC
  Administered 2021-09-25: 40 mg via INTRAVENOUS
  Filled 2021-09-25: qty 4

## 2021-09-25 MED ORDER — SODIUM BICARBONATE 8.4 % IV SOLN: 50.0000 meq | Freq: Once | INTRAVENOUS | Status: AC

## 2021-09-25 MED ORDER — SODIUM CHLORIDE 0.9 % IV SOLN
250.0000 mL | INTRAVENOUS | Status: DC
  Administered 2021-09-25: 250 mL via INTRAVENOUS

## 2021-09-25 MED ORDER — DEXTROSE 50 % IV SOLN
25.0000 mL | Freq: Once | INTRAVENOUS | Status: AC
  Administered 2021-09-25: 25 mL via INTRAVENOUS

## 2021-09-25 MED ORDER — SODIUM BICARBONATE 8.4 % IV SOLN
INTRAVENOUS | Status: AC
  Administered 2021-09-25: 50 meq via INTRAVENOUS
  Filled 2021-09-25: qty 50

## 2021-09-25 MED ORDER — LOPERAMIDE HCL 2 MG PO CAPS: 2.0000 mg | ORAL_CAPSULE | ORAL | Status: DC | PRN

## 2021-09-25 MED ORDER — PIPERACILLIN-TAZOBACTAM 3.375 G IVPB 30 MIN
3.3750 g | Freq: Once | INTRAVENOUS | Status: AC
  Administered 2021-09-25: 3.375 g via INTRAVENOUS
  Filled 2021-09-25: qty 50

## 2021-09-25 MED ORDER — SODIUM CHLORIDE 0.9 % IV SOLN
2.0000 g | INTRAVENOUS | Status: DC
  Administered 2021-09-26: 2 g via INTRAVENOUS
  Filled 2021-09-25: qty 20

## 2021-09-25 MED ORDER — NOREPINEPHRINE 4 MG/250ML-% IV SOLN
2.0000 ug/min | INTRAVENOUS | Status: DC
  Administered 2021-09-25: 12 ug/min via INTRAVENOUS
  Administered 2021-09-25: 8 ug/min via INTRAVENOUS
  Administered 2021-09-26: 10 ug/min via INTRAVENOUS
  Filled 2021-09-25 (×3): qty 250

## 2021-09-25 MED ORDER — FUROSEMIDE 10 MG/ML IJ SOLN
100.0000 mg | Freq: Once | INTRAVENOUS | Status: DC
  Filled 2021-09-25: qty 10

## 2021-09-25 MED ORDER — SODIUM CHLORIDE 0.9 % IV SOLN
2.0000 g | INTRAVENOUS | Status: DC
  Administered 2021-09-25: 2 g via INTRAVENOUS

## 2021-09-25 MED ORDER — ORAL CARE MOUTH RINSE
15.0000 mL | OROMUCOSAL | Status: DC | PRN
Start: 2021-09-25 — End: 2021-09-26

## 2021-09-25 MED ORDER — SODIUM CHLORIDE 0.9% IV SOLUTION: Freq: Once | INTRAVENOUS | Status: AC

## 2021-09-25 NOTE — Progress Notes (Signed)
  Transition of Care [facility]) Screening Note   Patient Details  Name: Bobby Fernandez Date of Birth: April 15, 1960   Transition of Care [facility]) CM/SW Contact:    Lavenia Atlas, RN Phone Number: 09/25/2021, 12:28 PM    Transition of Care Department [facility]) has reviewed patient and no TOC needs have been identified at this time. We will continue to monitor patient advancement through interdisciplinary progression rounds. If new patient transition needs arise, please place a TOC consult.

## 2021-09-25 NOTE — Progress Notes (Signed)
PHARMACY - PHYSICIAN COMMUNICATION CRITICAL VALUE ALERT - BLOOD CULTURE IDENTIFICATION (BCID)  Bobby Fernandez is an 61 y.o. male who presented to [facility] on [facility]/21/2021 with a chief complaint of fall  Assessment:  2/2 Ecoli, no resistanace  Name of physician (or Provider) Contacted: Ogan  Current antibiotics: cefepime  Changes to prescribed antibiotics recommended:  Change to CTX 2gm IV q24h  Results for orders placed or performed during the hospital encounter of 09/29/2021  Blood Culture ID Panel (Reflexed) (Collected: 09/25/2021  9:44 AM)  Result Value Ref Range   Enterococcus faecalis NOT DETECTED NOT DETECTED   Enterococcus Faecium NOT DETECTED NOT DETECTED   Listeria monocytogenes NOT DETECTED NOT DETECTED   Staphylococcus species NOT DETECTED NOT DETECTED   Staphylococcus aureus (BCID) NOT DETECTED NOT DETECTED   Staphylococcus epidermidis NOT DETECTED NOT DETECTED   Staphylococcus lugdunensis NOT DETECTED NOT DETECTED   Streptococcus species NOT DETECTED NOT DETECTED   Streptococcus agalactiae NOT DETECTED NOT DETECTED   Streptococcus pneumoniae NOT DETECTED NOT DETECTED   Streptococcus pyogenes NOT DETECTED NOT DETECTED   A.calcoaceticus-baumannii NOT DETECTED NOT DETECTED   Bacteroides fragilis NOT DETECTED NOT DETECTED   Enterobacterales DETECTED (A) NOT DETECTED   Enterobacter cloacae complex NOT DETECTED NOT DETECTED   Escherichia coli DETECTED (A) NOT DETECTED   Klebsiella aerogenes NOT DETECTED NOT DETECTED   Klebsiella oxytoca NOT DETECTED NOT DETECTED   Klebsiella pneumoniae NOT DETECTED NOT DETECTED   Proteus species NOT DETECTED NOT DETECTED   Salmonella species NOT DETECTED NOT DETECTED   Serratia marcescens NOT DETECTED NOT DETECTED   Haemophilus influenzae NOT DETECTED NOT DETECTED   Neisseria meningitidis NOT DETECTED NOT DETECTED   Pseudomonas aeruginosa NOT DETECTED NOT DETECTED   Stenotrophomonas maltophilia NOT DETECTED NOT DETECTED   Candida albicans  NOT DETECTED NOT DETECTED   Candida auris NOT DETECTED NOT DETECTED   Candida glabrata NOT DETECTED NOT DETECTED   Candida krusei NOT DETECTED NOT DETECTED   Candida parapsilosis NOT DETECTED NOT DETECTED   Candida tropicalis NOT DETECTED NOT DETECTED   Cryptococcus neoformans/gattii NOT DETECTED NOT DETECTED   CTX-M ESBL NOT DETECTED NOT DETECTED   Carbapenem resistance IMP NOT DETECTED NOT DETECTED   Carbapenem resistance KPC NOT DETECTED NOT DETECTED   Carbapenem resistance NDM NOT DETECTED NOT DETECTED   Carbapenem resist OXA 48 LIKE NOT DETECTED NOT DETECTED   Carbapenem resistance VIM NOT DETECTED NOT DETECTED    Arley Phenix RPh 09/25/2021, 10:37 PM

## 2021-09-25 NOTE — Consult Note (Addendum)
Renal Service Consult Note [facility]  Bobby Fernandez 09/25/2021 Bobby Krabbe, MD Requesting Physician: Dr. Judeth Horn  Reason for Consult: Renal failure  HPI: The patient is a 61 y.o. year-old w/ hx of DM2, HL who presented yesterday after being found on the floor yest morning. He had been vomiting 9/4 in the morning. He fell around midnight and was found in the morning by his family. Normally he uses a wheelchair and a walker (hx L AKA). Came to ED w/ back pain, somewhat disoriented. In ED initial BP 100/60, HR 120, CBG 171. Got 400 cc NS. Labs showed BUN 66, creat 3.93, CO2 16, Cl 105,  AG 13, alb 2.8, CPK 7500, trop 104. WBC 11.7, Hb 7.1, plt 117. Pt rec'd 1.5 L NS, then bicarb gtt at 100 cc/hr, IV zosyn and was admitted. This am pt became more and more tachypneic w/ RR in the 30s. ABG showed pH 7.3, pCO2 19, pO2 137. Today creat up to 4.64 and CO2 down to 10 w/ normal anion gap of 15. Pt was placed on bipap and moved to ICU.  Hb dropped to 5.8 and plts dropped to from 117 to 65. LA 1.4.  BP's have been soft today as well but not requiring pressors. UOP 300cc yest and 200 cc overnight. I/O= 2.9 L in and 500 cc out = 1.5 L net + since admit yesterday. We are asked to see for renal failure.   Pt seen in ICU. On bipap, cannot get good hx, pt agitated.   ROS - denies CP, no joint pain, no dysuria, no difficulty voiding   Past Medical History History reviewed. No pertinent past medical history. Past Surgical History History reviewed. No pertinent surgical history. Family History History reviewed. No pertinent family history. Social History  reports that he has quit smoking. He has never used smokeless tobacco. He reports that he does not currently use alcohol. He reports that he does not currently use drugs. Allergies  Allergies  Allergen Reactions   Codeine Nausea And Vomiting   Home medications Prior to Admission medications   Medication Sig Start Date End Date Taking?  Authorizing Provider  acetaminophen (TYLENOL) 500 MG tablet Take 1,000 mg by mouth every 6 (six) hours as needed for moderate pain or mild pain.   Yes [provider]  aspirin EC 81 MG tablet Take 81 mg by mouth daily. Swallow whole.   Yes [provider]  Bacillus Coagulans-Inulin (PROBIOTIC) 1-250 BILLION-MG CAPS Take 1 capsule by mouth daily. 12/31/19  Yes Ward, Kristen N, DO  FARXIGA 5 MG TABS tablet Take 5 mg by mouth daily. 04/09/21  Yes [provider]  ferrous sulfate 324 (65 Fe) MG TBEC Take 1 tablet by mouth daily. 09/18/20  Yes [provider]  furosemide (LASIX) 40 MG tablet Take 40 mg by mouth 2 (two) times daily. 04/06/21  Yes [provider]  lisinopril (ZESTRIL) 2.5 MG tablet Take 2.5 mg by mouth daily. 06/21/21  Yes [provider]  metFORMIN (GLUCOPHAGE-XR) 500 MG 24 hr tablet Take 500 mg by mouth daily. 09/02/21  Yes [provider]  pravastatin (PRAVACHOL) 20 MG tablet Take 20 mg by mouth daily. 08/21/21  Yes [provider]  carvedilol (COREG) 6.25 MG tablet Take 6.25 mg by mouth 2 (two) times daily. 08/03/21   [provider]     Vitals:   09/25/21 0900 09/25/21 1000 09/25/21 1158 09/25/21 1204  BP: 113/62   (!) 91/48  Pulse: Marland Kitchen)  135 (!) 138 (!) 132   Resp: (!) 37 (!) 40 (!) 33   Temp: 98 F (36.7 C)     TempSrc: Oral     SpO2: 97% 93% 100%   Weight: 76.1 kg     Height: 5\' 11"  (1.803 m)      Exam Gen alert, tachypneic on bipap, responsive, Ox 3 No rash, cyanosis or gangrene Sclera anicteric, throat clear  No jvd or bruits Chest clear bilat to bases, no rales/ wheezing, good air movement RRR no MRG Abd soft ntnd no mass or ascites +bs, no hsm GU condom cath in place, normal male MS L AKA Ext no LE or UE edema, poor skin turgor, no wounds or ulcers Neuro is alert, Ox 3 , nf      Home meds include - aspirin, farxiga, furosemide, lisinopril, metformin, pravastatin, carvedilol    BP's  soft here 90- 110/ 50-70   Na 129  K 4.6  CO2 10  glucose 85  BUN 77  Creat 4.64      Alb 2.9  CA 7.1  AG 13  ast 120 alt 28  tbili 1.0   Ca 6.7   CPK 7500 > 4400    wbc 11.7 > 8.7 > 9.7   Hb 7.1 > 7.0 > 5.8 this am    Plt 117 > 95 > 65 today    UA 9/4 - cloudy, many bact, > 50 wbc/ rbc , +wbc clumps, prot 100    Abd 11/4  9/4 - R 13.3 cm, no hydronephrosis. L 13.0 cm, no hydronephrosis    CXR 9/4 - no active disease     CXR 9/5 - possible vasc congestion     FOB + 9/4  Assessment/ Plan: Renal failure - normal creat 0.6 in 2021. Creat here 3.9 on admit in patient w/ fevers, hypotension, vol depletion on SGLT-2 inhibitor and ACEi. He rec'd IVF's overnight. Creat up to 4.6 today. Renal 2022 w/o hydro, UA looks infected. On exam looks dry and BP's remain soft. Agree w/ repeat 1L bolus and resume IVF's w/ bicarb gtt per CCM. Suspect AKI due to vol depletion/ meds (acei, diuretics, farxiga) + hypotension +/- sepsis. Would place foley catheter. Avoid all nsaids, contrast. Hold all BP meds and diuretics. Will follow.  Tachypnea - but not hypoxic, CXR yest was clear (today's film is poor), per CCM, giving IV bicarb to help correct sig NAGA.  Hypotension - vol depletion and/or sepsis Sepsis - getting empiric IV abx per CCM, urine/ blood cx's pending Metabolic acidosis - on SGLT-2 inhibitor which can cause sig eDKA but he doesn't appear to have ketones and AG is wnl. Not sure cause, ? Diarrhea history.  Anemia -  not sure cause, +FOB, Hb 5.8 today, will get prbcs Thrombocytopenia - rapid drop, check hapto/ LDH DM2 - on metformin, farxiga at home      [place]  MD 09/25/2021, 1:39 PM Recent Labs  Lab 10/06/2021 1438 10/14/2021 1911 09/25/21 0112 09/25/21 1224  HGB 7.1* 7.6* 7.0*  --   ALBUMIN 2.8*  --  2.9*  --   CALCIUM 7.9*  --  7.1* 6.7*  CREATININE 3.93*  --  4.04* 4.64*  K 4.4  --  3.6 4.6

## 2021-09-25 NOTE — Progress Notes (Signed)
eLink Physician-Brief Progress Note Patient Name: Bobby Fernandez DOB: 08-29-1960 MRN: [id number]   Date of Service  09/25/2021  HPI/Events of Note  Patient with frequent loose stools, no fever or significant WBC count, no obvious contraindication to Flexiseal.  eICU Interventions  Flexiseal ordered.        Migdalia Dk 09/25/2021, 9:34 PM

## 2021-09-25 NOTE — Consult Note (Addendum)
NAME:  Bobby Fernandez, MRN:  [id number], DOB:  1960-08-08, LOS: 1 ADMISSION DATE:  09/23/2021, CONSULTATION DATE: 9/5 REFERRING MD: Dr. 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  He presented to [facility] emergency department on 9/4 with complaints of vomiting and fall for the preceding 24 hours.  After fall he was down for 13 hours until found by his brother.  Laboratory evaluation in the emergency department significant for serum creatinine 3.9, bicarb 16, BUN 66, AST 99, GFR 16.  As well as hemoglobin 7.1, platelets 117, WBC 11.7.  CK elevated at 7500.  He was admitted to the hospitalist service for treatment of acute kidney injury secondary to rhabdomyolysis as well as symptomatic anemia secondary to suspected GI bleed.  Hemoccult positive.  Despite treatment with IV fluids, on 5/5 he became tachycardic, hypotensive, and tachypneic.  He was transferred to the ICU for closer monitoring. Shortly after ICU transfer he was started on BiPAP and became less responsive. Systolic blood pressures dipped into the 80s. PCCM consulted.   Pertinent  Medical History   has no past medical history on file. Hypertension, hyperlipidemia, DM 2, left AKA, HFrEF.   Significant Hospital Events: Including procedures, antibiotic start and stop dates in addition to other pertinent events   9/4 admitted for AKI, rhabdo following a fall at home.  9/5 tx to ICU. Started on BiPAP, Pressors.   Interim History / Subjective:    Objective   Blood pressure (!) 91/48, pulse (!) 132, temperature 98 F (36.7 C), temperature source Oral, resp. rate (!) 33, height 5\' 11"  (1.803 m), weight 76.1 kg, SpO2 100 %.        Intake/Output  Summary (Last 24 hours) at 09/25/2021 1255 Last data filed at 09/25/2021 0902 Gross per 24 hour  Intake 2894.21 ml  Output 500 ml  Net 2394.21 ml   Filed Weights   09/30/2021 2023 09/25/21 0500 09/25/21 0900  Weight: 75.2 kg 75.1 kg 76.1 kg    Examination: General: thin adult male in moderate distress on BiPAP HENT: [place]/AT, PERRL, no JVD Lungs: Clear bilateral breath sounds. Distress. Tachypnea rate 35.  Cardiovascular: Tachy, regular, no MRG Abdomen: Soft, non-tender, non-distended Extremities: L AKA. No acute deformity Neuro: Somnolent, will arouse to voice and respond appropriately.  GU: Condom cath  [facility] Problem list     Assessment & Plan:   AKI secondary to rhabdomyolysis and hypovolemia CK 7000 on admission down to 4000 - Difficult to ascertain volume status. CXR looks wet, however, clinically he is dry. On BiPAP but not hypoxic.  - Give volume: additional 2L bolus now - Trend BMP - Nephrology has been consulted  Acute mixed metabolic acidosis: Uremia, diarrhea. Bicarb 10 - Bicarb amp now - Bicarb infusion - Trend chemistry  Shock: hypovolemic vs septic in origin. Fever. - Volume - NE for MAP 65 - UA dirty, will start ceftriaxone IV - Echo done, read pending.   Respiratory distress: some mild congestion vs low volumes on CXR. Suspect distress largely related to respiratory compensation for metabolic acidosis.  - BiPAP PRN - Intermittent CXR - HFrEF at risk for pulm edema. Tolerating fluid OK on BiPAP for now.   Symptomatic anemia: Occult blood positive. Suspect GIB.  - hemoglobin 5.8   -  Transfuse 2 units PRBC - Protonix BID - Trend H&H - GI consulted  Chronic HFrEF: most recent EF 50% - holding home coreg, lisniopril, furosemide  DM2 - CBG monitoring and SSI - Hold home Farxiga, metformin  Fall: trauma eval negative - PT when appropriate  LFT elevation: RUQ ultrasound with cholelithiasis, no cholecystitis - Trend LFT - hold  statin   Best Practice (right click and "Reselect all SmartList Selections" daily)   Diet/type: NPO DVT prophylaxis: SCD GI prophylaxis: PPI Lines: N/A Foley:  Yes, and it is still needed Code Status:  full code Last date of multidisciplinary goals of care discussion [ ]   Labs   CBC: Recent Labs  Lab 09/23/2021 1438 09/29/2021 1911 09/25/21 0112  WBC 11.7*  --  8.6  NEUTROABS 10.3*  --   --   HGB 7.1* 7.6* 7.0*  HCT 21.6* 23.8* 21.4*  MCV 76.3*  --  76.7*  PLT 117*  --  95*    Basic Metabolic Panel: Recent Labs  Lab 10/16/2021 1438 09/25/21 0112  NA 134* 129*  K 4.4 3.6  CL 105 103  CO2 16* 13*  GLUCOSE 141* 84  BUN 66* 70*  CREATININE 3.93* 4.04*  CALCIUM 7.9* 7.1*   GFR: Estimated Creatinine Clearance: 20.2 mL/min (A) (by C-G formula based on SCr of 4.04 mg/dL (H)). Recent Labs  Lab 09/27/2021 1438 09/25/21 0112  WBC 11.7* 8.6  LATICACIDVEN 1.4  --     Liver Function Tests: Recent Labs  Lab 10/02/2021 1438 09/25/21 0112  AST 99* 120*  ALT 23 28  ALKPHOS 57 67  BILITOT 1.0 1.0  PROT 6.2* 6.3*  ALBUMIN 2.8* 2.9*   Recent Labs  Lab 10/13/2021 1438  LIPASE 17   No results for input(s): "AMMONIA" in the last 168 hours.  ABG    Component Value Date/Time   PHART 7.32 (L) 09/25/2021 1220   PCO2ART 19 (LL) 09/25/2021 1220   PO2ART 137 (H) 09/25/2021 1220   HCO3 9.8 (L) 09/25/2021 1220   ACIDBASEDEF 14.8 (H) 09/25/2021 1220   O2SAT 99.8 09/25/2021 1220     Coagulation Profile: No results for input(s): "INR", "PROTIME" in the last 168 hours.  Cardiac Enzymes: Recent Labs  Lab 10/20/2021 1438 09/25/21 0112  CKTOTAL 7,503* 4,436*    HbA1C: Hgb A1c MFr Bld  Date/Time Value Ref Range Status  10/15/2021 07:11 PM 5.7 (H) 4.8 - 5.6 % Final    Comment:    (NOTE) Pre diabetes:          5.7%-6.4%  Diabetes:              >6.4%  Glycemic control for   <7.0% adults with diabetes   08/20/2019 02:58 PM 9.9 (H) 4.8 - 5.6 % Final    Comment:     (NOTE) Pre diabetes:          5.7%-6.4%  Diabetes:              >6.4%  Glycemic control for   <7.0% adults with diabetes     CBG: Recent Labs  Lab 09/30/2021 2059 09/25/21 0714 09/25/21 1141 09/25/21 1227  GLUCAP 102* 79 42* 79    Review of Systems:   Bolds are positive  Constitutional: weight loss, gain, night sweats, Fevers, chills, fatigue . Thirsty HEENT: headaches, Sore throat, sneezing, nasal congestion, post nasal drip, Difficulty swallowing, Tooth/dental problems, visual complaints visual changes, ear ache CV:  chest pain, radiates:,Orthopnea, PND, swelling in lower extremities**, dizziness, palpitations, syncope.  GI  heartburn, indigestion, abdominal pain, nausea, vomiting, diarrhea, change in bowel habits, loss of appetite, bloody stools.  Resp: cough, productive: , hemoptysis, dyspnea, chest pain, pleuritic.  Skin: rash or itching or icterus GU: dysuria, change in color of urine, urgency or frequency. flank pain, hematuria  MS: joint pain or swelling. decreased range of motion  Psych: change in mood or affect. depression or anxiety.  Neuro: difficulty with speech, weakness, numbness, ataxia    Past Medical History:  He,  has no past medical history on file.   Surgical History:  History reviewed. No pertinent surgical history.   Social History:   reports that he has quit smoking. He has never used smokeless tobacco. He reports that he does not currently use alcohol. He reports that he does not currently use drugs.   Family History:  His family history is not on file.   Allergies Allergies  Allergen Reactions   Codeine Nausea And Vomiting     Home Medications  Prior to Admission medications   Medication Sig Start Date End Date Taking? Authorizing Provider  acetaminophen (TYLENOL) 500 MG tablet Take 1,000 mg by mouth every 6 (six) hours as needed for moderate pain or mild pain.   Yes [provider]  aspirin EC 81 MG tablet Take 81 mg by mouth  daily. Swallow whole.   Yes [provider]  Bacillus Coagulans-Inulin (PROBIOTIC) 1-250 BILLION-MG CAPS Take 1 capsule by mouth daily. 12/31/19  Yes Ward, Kristen N, DO  FARXIGA 5 MG TABS tablet Take 5 mg by mouth daily. 04/09/21  Yes [provider]  ferrous sulfate 324 (65 Fe) MG TBEC Take 1 tablet by mouth daily. 09/18/20  Yes [provider]  furosemide (LASIX) 40 MG tablet Take 40 mg by mouth 2 (two) times daily. 04/06/21  Yes [provider]  lisinopril (ZESTRIL) 2.5 MG tablet Take 2.5 mg by mouth daily. 06/21/21  Yes [provider]  metFORMIN (GLUCOPHAGE-XR) 500 MG 24 hr tablet Take 500 mg by mouth daily. 09/02/21  Yes [provider]  pravastatin (PRAVACHOL) 20 MG tablet Take 20 mg by mouth daily. 08/21/21  Yes [provider]  carvedilol (COREG) 6.25 MG tablet Take 6.25 mg by mouth 2 (two) times daily. 08/03/21   [provider]     Critical care time: 60 minutes     Joneen Roach, AGACNP-BC [facility]  See Amion for personal pager PCCM on call pager [phone] until 7pm. Please call Elink 7p-7a. [phone]  09/25/2021 2:05 PM

## 2021-09-25 NOTE — Progress Notes (Signed)
RN in room for morning assessment, patient noticeably short of breath.  O2 sat 100% on room air however respirations in the 30s, patient with audible wheeze.  MD notified of above.  MD on the way to assess patient.

## 2021-09-25 NOTE — Progress Notes (Signed)
Pt urine output of 200 ml overnight. Bladder scan read 30-50 ml . NP Garner Nash made aware. Will continue to monitor

## 2021-09-25 NOTE — Progress Notes (Addendum)
PCCM INTERVAL PROGRESS NOTE   Repeat hemoglobin back at 5.8. Transfusing 2 units PRBC and will check H&H after Remains on norepi, but responding well to blood so far. SBP up to 116. Can start weaning pressors. OK to give more volume as well once blood is transfused.  Delay in getting bicarb drip started r/t prep time. Starting infusion now and will give an amp.  Antibiotics broadened to cefepime for presumed UTI. Hx of pseudomonas pan sensitive in a wound.  Appreciate nephrology. Will plan to continue IVF resuscitation and trend labs. Seems like he has some baseline CKD, hopefully he can avoid RRT. Hanging in there on BiPAP. Work of breathing will hopefully improve with Bicarb  Critical care time 10 minutes  Joneen Roach, AGACNP-BC [facility]  See Amion for personal pager PCCM on call pager [phone] until 7pm. Please call Elink 7p-7a. [phone]  09/25/2021 5:15 PM

## 2021-09-25 NOTE — Progress Notes (Signed)
eLink Physician-Brief Progress Note Patient Name: Bobby Fernandez DOB: January 19, 1961 MRN: [id number]   Date of Service  09/25/2021  HPI/Events of Note  Bedside RN was worried about potential for patient to get restless, right now he is calm and very drowsy.  eICU Interventions  Will defer ordering additional potentially sedating medications at this time.        Thomasene Lot Adolph Clutter 09/25/2021, 8:27 PM

## 2021-09-25 NOTE — Progress Notes (Signed)
Critical Labs  NP Hoffman at bedside for ABGs results at 12:20pm. Cr+ 4.64 & troponin of 196 @ 12:28. 13:27: NP Hoffman notified of Hgb 5.8 14:53: NP Hoffman notified of Troponin 217.

## 2021-09-25 NOTE — Progress Notes (Signed)
Notified Lab that ABG being sent for analysis. 

## 2021-09-25 NOTE — Progress Notes (Signed)
   09/25/21 0816  Assess: MEWS Score  Temp 98.5 F (36.9 C)  BP 128/80  MAP (mmHg) 95  Pulse Rate (!) 134  Resp (!) 32  Level of Consciousness Alert  SpO2 98 %  O2 Device Room Air  Assess: MEWS Score  MEWS Temp 0  MEWS Systolic 0  MEWS Pulse 3  MEWS RR 2  MEWS LOC 0  MEWS Score 5  MEWS Score Color Red  Assess: if the MEWS score is Yellow or Red  Were vital signs taken at a resting state? Yes  Focused Assessment No change from prior assessment  Does the patient meet 2 or more of the SIRS criteria? Yes  Does the patient have a confirmed or suspected source of infection? No  MEWS guidelines implemented *See Row Information* Yes  Treat  MEWS Interventions Escalated (See documentation below)  Pain Scale 0-10  Pain Score 0  Take Vital Signs  Increase Vital Sign Frequency  Red: Q 1hr X 4 then Q 4hr X 4, if remains red, continue Q 4hrs  Escalate  MEWS: Escalate Red: discuss with charge nurse/RN and provider, consider discussing with RRT  Notify: Charge Nurse/RN  Name of Charge Nurse/RN Notified Baird Lyons  Date Charge Nurse/RN Notified 09/25/21  Time Charge Nurse/RN Notified 0820  Notify: Provider  Provider Name/Title Rizwan  Date Provider Notified 09/25/21  Time Provider Notified 0800  Method of Notification Page  Notification Reason Change in status  Provider response At bedside;See new orders  Date of Provider Response 09/25/21  Time of Provider Response 0810  Document  Patient Outcome Transferred/level of care increased  Assess: SIRS CRITERIA  SIRS Temperature  0  SIRS Pulse 1  SIRS Respirations  1  SIRS WBC 1  SIRS Score Sum  3

## 2021-09-25 NOTE — Progress Notes (Signed)
Pt sats 85%. Pt put on 4L [place]. Pt slurring speech. Pt uable to squeeze hands/follow commands. BP down to the 70s systolic. Pupils sluggish. Pt breathing labored. CBG checked & was 42. Gave amp of D50. CBG after 15 mins 113. Dr. Butler Denmark notified & at bedside. Lasix on hold. Pt placed on bipap. Zosyn given. Consult to CCM. NP Hoffman placed order for Levo. Check CBG Q1 hour. Give 2L LR boluses. CBG in the 70s. Gave another amp of D50. Gave 2 amps of bicarb for acidosis. Foley placed for retention.

## 2021-09-25 NOTE — Progress Notes (Signed)
Pharmacy Antibiotic Note  Bobby Fernandez is a 61 y.o. male admitted on 10/09/2021. Patient with shock started on vasopressors. Concern for hypovolemia vs septic origin. Pharmacy has been consulted for cefepime dosing for UTI.  Plan: Cefepime 2 g IV q24h (based on current renal function) Pharmacy to sign off consult but will continue to follow renal function, cultures and clinical progress for antibiotic dose adjustments and de-escalation as indicated  Height: 5\' 11"  (180.3 cm) Weight: 76.1 kg (167 lb 12.3 oz) IBW/kg (Calculated) : 75.3  Temp (24hrs), Avg:99.3 F (37.4 C), Min:98 F (36.7 C), Max:102.8 F (39.3 C)  Recent Labs  Lab 09/23/2021 1438 09/25/21 0112 09/25/21 1224 09/25/21 1327  WBC 11.7* 8.6  --  9.7  CREATININE 3.93* 4.04* 4.64*  --   LATICACIDVEN 1.4  --   --   --     Estimated Creatinine Clearance: 17.6 mL/min (A) (by C-G formula based on SCr of 4.64 mg/dL (H)).    Allergies  Allergen Reactions   Codeine Nausea And Vomiting    Antimicrobials this admission: Cefepime 9/5 >> Zosyn 9/5 x 1   Microbiology results: 9/5 BCx: pending 9/4 UCx: pending  9/5 MRSA PCR: negative  Thank you for allowing pharmacy to be a part of this patient's care.  11/5, PharmD, BCPS Clinical Pharmacist 09/25/2021 2:36 PM

## 2021-09-25 NOTE — Progress Notes (Signed)
PT Cancellation Note  Patient Details Name: Allan Minotti MRN: [id number] DOB: 1960/09/20   Cancelled Treatment:    Reason Eval/Treat Not Completed: Medical issues which prohibited therapy-xfer to ICU.     Faye Ramsay, PT Acute Rehabilitation  Office: [phone] Pager: [phone]

## 2021-09-26 ENCOUNTER — Inpatient Hospital Stay (HOSPITAL_COMMUNITY): Payer: 59

## 2021-09-26 ENCOUNTER — Inpatient Hospital Stay: Payer: Self-pay

## 2021-09-26 DIAGNOSIS — Z515 Encounter for palliative care: Secondary | ICD-10-CM

## 2021-09-26 DIAGNOSIS — B9620 Unspecified Escherichia coli [E. coli] as the cause of diseases classified elsewhere: Secondary | ICD-10-CM

## 2021-09-26 DIAGNOSIS — Z66 Do not resuscitate: Secondary | ICD-10-CM

## 2021-09-26 DIAGNOSIS — N179 Acute kidney failure, unspecified: Secondary | ICD-10-CM | POA: Diagnosis not present

## 2021-09-26 LAB — GLUCOSE, CAPILLARY
Glucose-Capillary: 106 mg/dL — ABNORMAL HIGH (ref 70–99)
Glucose-Capillary: 79 mg/dL (ref 70–99)
Glucose-Capillary: 73 mg/dL (ref 70–99)

## 2021-09-26 LAB — BPAM RBC
Blood Product Expiration Date: 202310042359
Blood Product Expiration Date: 202310042359
ISSUE DATE / TIME: 202309051433
ISSUE DATE / TIME: 202309051739
Unit Type and Rh: 5100
Unit Type and Rh: 5100

## 2021-09-26 LAB — BASIC METABOLIC PANEL
Anion gap: 18 — ABNORMAL HIGH (ref 5–15)
BUN: 86 mg/dL — ABNORMAL HIGH (ref 8–23)
CO2: 18 mmol/L — ABNORMAL LOW (ref 22–32)
Calcium: 7.1 mg/dL — ABNORMAL LOW (ref 8.9–10.3)
Chloride: 103 mmol/L (ref 98–111)
Creatinine, Ser: 4.66 mg/dL — ABNORMAL HIGH (ref 0.61–1.24)
GFR, Estimated: 13 mL/min — ABNORMAL LOW (ref >60)
Glucose, Bld: 131 mg/dL — ABNORMAL HIGH (ref 70–99)
Potassium: 3.7 mmol/L (ref 3.5–5.1)
Sodium: 139 mmol/L (ref 135–145)

## 2021-09-26 LAB — COMPREHENSIVE METABOLIC PANEL
ALT: 33 U/L (ref 0–44)
AST: 76 U/L — ABNORMAL HIGH (ref 15–41)
Albumin: 2.2 g/dL — ABNORMAL LOW (ref 3.5–5.0)
Alkaline Phosphatase: 59 U/L (ref 38–126)
Anion gap: 14 (ref 5–15)
BUN: 86 mg/dL — ABNORMAL HIGH (ref 8–23)
CO2: 17 mmol/L — ABNORMAL LOW (ref 22–32)
Calcium: 6.7 mg/dL — ABNORMAL LOW (ref 8.9–10.3)
Chloride: 105 mmol/L (ref 98–111)
Creatinine, Ser: 4.77 mg/dL — ABNORMAL HIGH (ref 0.61–1.24)
GFR, Estimated: 13 mL/min — ABNORMAL LOW (ref >60)
Glucose, Bld: 80 mg/dL (ref 70–99)
Potassium: 4.2 mmol/L (ref 3.5–5.1)
Sodium: 136 mmol/L (ref 135–145)
Total Bilirubin: 1.6 mg/dL — ABNORMAL HIGH (ref 0.3–1.2)
Total Protein: 5.1 g/dL — ABNORMAL LOW (ref 6.5–8.1)

## 2021-09-26 LAB — CBC
HCT: 26.2 % — ABNORMAL LOW (ref 39.0–52.0)
Hemoglobin: 8.6 g/dL — ABNORMAL LOW (ref 13.0–17.0)
MCH: 25.8 pg — ABNORMAL LOW (ref 26.0–34.0)
MCHC: 32.8 g/dL (ref 30.0–36.0)
MCV: 78.7 fL — ABNORMAL LOW (ref 80.0–100.0)
Platelets: 46 10*3/uL — ABNORMAL LOW (ref 150–400)
RBC: 3.33 MIL/uL — ABNORMAL LOW (ref 4.22–5.81)
RDW: 18.7 % — ABNORMAL HIGH (ref 11.5–15.5)
WBC: 23.9 10*3/uL — ABNORMAL HIGH (ref 4.0–10.5)
nRBC: 0.1 % (ref 0.0–0.2)

## 2021-09-26 LAB — PROTIME-INR
INR: 1.3 — ABNORMAL HIGH (ref 0.8–1.2)
Prothrombin Time: 16 seconds — ABNORMAL HIGH (ref 11.4–15.2)

## 2021-09-26 LAB — URINE CULTURE: Culture: >=100000 — AB

## 2021-09-26 LAB — MAGNESIUM: Magnesium: 1.7 mg/dL (ref 1.7–2.4)

## 2021-09-26 LAB — TYPE AND SCREEN
ABO/RH(D): O POS
Antibody Screen: NEGATIVE
Unit division: 0
Unit division: 0

## 2021-09-26 LAB — PHOSPHORUS: Phosphorus: 6.4 mg/dL — ABNORMAL HIGH (ref 2.5–4.6)

## 2021-09-26 LAB — CK: Total CK: 761 U/L — ABNORMAL HIGH (ref 49–397)

## 2021-09-26 LAB — HAPTOGLOBIN: Haptoglobin: 182 mg/dL (ref 32–363)

## 2021-09-26 LAB — LACTIC ACID, PLASMA: Lactic Acid, Venous: 3.7 mmol/L (ref 0.5–1.9)

## 2021-09-26 MED ORDER — FENTANYL BOLUS VIA INFUSION
50.0000 ug | INTRAVENOUS | Status: DC | PRN
  Administered 2021-09-26: 100 ug via INTRAVENOUS

## 2021-09-26 MED ORDER — POLYETHYLENE GLYCOL 3350 17 G PO PACK: 17.0000 g | PACK | Freq: Every day | ORAL | Status: DC

## 2021-09-26 MED ORDER — SODIUM CHLORIDE 0.9 % IV SOLN
1.0000 g | Freq: Two times a day (BID) | INTRAVENOUS | Status: DC
  Filled 2021-09-26: qty 20

## 2021-09-26 MED ORDER — ROCURONIUM BROMIDE 10 MG/ML (PF) SYRINGE
PREFILLED_SYRINGE | INTRAVENOUS | Status: AC
  Filled 2021-09-26: qty 10

## 2021-09-26 MED ORDER — GLYCOPYRROLATE 0.2 MG/ML IJ SOLN: 0.2000 mg | INTRAMUSCULAR | Status: DC | PRN

## 2021-09-26 MED ORDER — FENTANYL 2500MCG IN NS 250ML (10MCG/ML) PREMIX INFUSION
50.0000 ug/h | INTRAVENOUS | Status: DC
  Administered 2021-09-26: 50 ug/h via INTRAVENOUS
  Filled 2021-09-26: qty 250

## 2021-09-26 MED ORDER — ONDANSETRON HCL 4 MG/2ML IJ SOLN: 4.0000 mg | Freq: Four times a day (QID) | INTRAMUSCULAR | Status: DC | PRN

## 2021-09-26 MED ORDER — FENTANYL CITRATE PF 50 MCG/ML IJ SOSY: 50.0000 ug | PREFILLED_SYRINGE | Freq: Once | INTRAMUSCULAR | Status: AC

## 2021-09-26 MED ORDER — VASOPRESSIN 20 UNITS/100 ML INFUSION FOR SHOCK
INTRAVENOUS | Status: AC
  Administered 2021-09-26: 0.03 [IU]/min via INTRAVENOUS
  Filled 2021-09-26: qty 100

## 2021-09-26 MED ORDER — MIDAZOLAM HCL 2 MG/2ML IJ SOLN
INTRAMUSCULAR | Status: DC
Start: 2021-09-26 — End: 2021-09-26
  Filled 2021-09-26: qty 2

## 2021-09-26 MED ORDER — STERILE WATER FOR INJECTION IJ SOLN
INTRAMUSCULAR | Status: AC
  Filled 2021-09-26: qty 10

## 2021-09-26 MED ORDER — ETOMIDATE 2 MG/ML IV SOLN
INTRAVENOUS | Status: AC
  Filled 2021-09-26: qty 20

## 2021-09-26 MED ORDER — AMIODARONE HCL IN DEXTROSE 360-4.14 MG/200ML-% IV SOLN
60.0000 mg/h | INTRAVENOUS | Status: DC
  Administered 2021-09-26: 60 mg/h via INTRAVENOUS
  Filled 2021-09-26: qty 200

## 2021-09-26 MED ORDER — AMIODARONE HCL IN DEXTROSE 360-4.14 MG/200ML-% IV SOLN: 30.0000 mg/h | INTRAVENOUS | Status: DC

## 2021-09-26 MED ORDER — NOREPINEPHRINE 16 MG/250ML-% IV SOLN
0.0000 ug/min | INTRAVENOUS | Status: DC
  Administered 2021-09-26: 40 ug/min via INTRAVENOUS
  Filled 2021-09-26 (×2): qty 250

## 2021-09-26 MED ORDER — MIDAZOLAM HCL 2 MG/2ML IJ SOLN: 2.0000 mg | INTRAMUSCULAR | Status: DC | PRN

## 2021-09-26 MED ORDER — NITROGLYCERIN 2 % TD OINT
1.0000 [in_us] | TOPICAL_OINTMENT | Freq: Three times a day (TID) | TRANSDERMAL | Status: DC
  Filled 2021-09-26: qty 30

## 2021-09-26 MED ORDER — VASOPRESSIN 20 UNITS/100 ML INFUSION FOR SHOCK: 0.0000 [IU]/min | INTRAVENOUS | Status: DC

## 2021-09-26 MED ORDER — SODIUM BICARBONATE 8.4 % IV SOLN
INTRAVENOUS | Status: AC
  Filled 2021-09-26: qty 50

## 2021-09-26 MED ORDER — POLYVINYL ALCOHOL 1.4 % OP SOLN: 1.0000 [drp] | Freq: Four times a day (QID) | OPHTHALMIC | Status: DC | PRN

## 2021-09-26 MED ORDER — NOREPINEPHRINE 4 MG/250ML-% IV SOLN: 0.0000 ug/min | INTRAVENOUS | Status: DC

## 2021-09-26 MED ORDER — LORAZEPAM 2 MG/ML IJ SOLN
4.0000 mg | INTRAMUSCULAR | Status: DC | PRN
  Administered 2021-09-26: 4 mg via INTRAVENOUS
  Filled 2021-09-26: qty 2

## 2021-09-26 MED ORDER — PHENTOLAMINE MESYLATE 5 MG IJ SOLR
5.0000 mg | Freq: Once | INTRAMUSCULAR | Status: AC
  Administered 2021-09-26: 5 mg via SUBCUTANEOUS
  Filled 2021-09-26: qty 5

## 2021-09-26 MED ORDER — AMIODARONE LOAD VIA INFUSION
150.0000 mg | Freq: Once | INTRAVENOUS | Status: AC
  Administered 2021-09-26: 150 mg via INTRAVENOUS
  Filled 2021-09-26: qty 83.34

## 2021-09-26 MED ORDER — ACETAMINOPHEN 325 MG PO TABS: 650.0000 mg | ORAL_TABLET | Freq: Four times a day (QID) | ORAL | Status: DC | PRN

## 2021-09-26 MED ORDER — GLYCOPYRROLATE 1 MG PO TABS: 1.0000 mg | ORAL_TABLET | ORAL | Status: DC | PRN

## 2021-09-26 MED ORDER — FENTANYL BOLUS VIA INFUSION: 200.0000 ug | INTRAVENOUS | Status: DC | PRN

## 2021-09-26 MED ORDER — ONDANSETRON 4 MG PO TBDP: 4.0000 mg | ORAL_TABLET | Freq: Four times a day (QID) | ORAL | Status: DC | PRN

## 2021-09-26 MED ORDER — SODIUM CHLORIDE 0.9 % IV SOLN: INTRAVENOUS | Status: DC

## 2021-09-26 MED ORDER — ACETAMINOPHEN 650 MG RE SUPP: 650.0000 mg | Freq: Four times a day (QID) | RECTAL | Status: DC | PRN

## 2021-09-26 MED ORDER — DOCUSATE SODIUM 50 MG/5ML PO LIQD: 100.0000 mg | Freq: Two times a day (BID) | ORAL | Status: DC

## 2021-09-26 MED ORDER — MORPHINE SULFATE (PF) 4 MG/ML IV SOLN
4.0000 mg | INTRAVENOUS | Status: DC | PRN
  Filled 2021-09-26: qty 1

## 2021-09-26 MED ORDER — FENTANYL CITRATE (PF) 100 MCG/2ML IJ SOLN
INTRAMUSCULAR | Status: AC
  Filled 2021-09-26: qty 2

## 2021-09-26 MED FILL — Medication: Qty: 1 | Status: AC

## 2021-09-27 LAB — CULTURE, BLOOD (ROUTINE X 2)
Special Requests: ADEQUATE
Special Requests: ADEQUATE

## 2021-09-27 LAB — URINE CULTURE: Culture: >=100000 — AB

## 2021-10-21 NOTE — Progress Notes (Signed)
eLink Physician-Brief Progress Note Patient Name: Bobby Fernandez DOB: Jul 23, 1960 MRN: [id number]   Date of Service  2021-10-08  HPI/Events of Note  Patient with swelling and bruising at the site of extravasation of a Norepinephrine gtt which has been switched to another iv and offending iv discontinued.  eICU Interventions  Phentolamine extravasation protocol ordered.        Acire Tang U Bereket Gernert 2021-10-08, 2:00 AM

## 2021-10-21 NOTE — Progress Notes (Signed)
eLink Physician-Brief Progress Note Patient Name: Bobby Fernandez DOB: 1960-07-25 MRN: [id number]   Date of Service  10-17-21  HPI/Events of Note  Bedside RN requesting order for a PICC line, however creatinine is > 4.0.  eICU Interventions  Patient does not meet criteria for PICC line placement, RN instructed to discuss possible central line placement with incoming daytime PCCM attending physician.        Thomasene Lot [name] 10-17-2021, 4:29 AM

## 2021-10-21 NOTE — Progress Notes (Signed)
PT Cancellation Note  Patient Details Name: Bobby Fernandez MRN: [id number] DOB: [birth year]   Cancelled Treatment:    Reason Eval/Treat Not Completed: (P) Patient not medically ready. Code blue; will sign off at this time, please re-consult when pt stable.     Domenick Bookbinder PT, DPT 10/15/2021, 9:54 AM

## 2021-10-21 NOTE — Progress Notes (Signed)
Physician wanted patient to have an art line placed. RT attempted twice to stick patient without success. Family decided to make patient comfort so all effort discontinued by RT.

## 2021-10-21 NOTE — IPAL (Addendum)
  Interdisciplinary Goals of Care Family Meeting   Date carried out: 2021/09/28  Location of the meeting: Bedside  Member's involved: Physician, Bedside Registered Nurse, and Family Member or next of kin  Durable Power of Attorney or acting medical decision maker: present, SIL   Bobby Fernandez  Discussion: We discussed goals of care for [name] .  Brother, Bobby Fernandez, and sister in [place] and Bobby Fernandez present.  Discussed recent events along with issues over the last 2 years with dwindling and multiple medical issues and hospitalizations.  Discussed the likely reason for decompensation this admission with rhabdomyolysis and renal failure leading to acid-base disturbances.  Discussed that unfortunately CODE BLUE event occurred this morning with about 14 minutes of resuscitation and subsequent achievement of spontaneous circulation.  Currently on the ventilator and multiple vasopressors.  Discussed his multisystem organ failure and subsequent code event with his in-hospital mortality is 90+ percent.  They have his advanced directives that indicate that he would not want life prolonging efforts if the chance of him surviving returning to previous functional state was low.  Given what was shared, they are confident that he would not want to be on life support.  They shared they had discussions of DNR in the past.  We discussed that he was DNR admission but change his CODE STATUS yesterday afternoon to full code.  Given patient wishes as verbally described as well as documented and legal paperwork, decision to move toward comfort care was made.  DNR placed.  Comfort orders placed.  Code status: Full DNR  Disposition: In-patient comfort care  Time spent for the meeting: 10 minutes    Karren Burly, MD  09-28-21, 10:29 AM

## 2021-10-21 NOTE — Progress Notes (Signed)
Chaplain was present at [facility].  Chaplain checked in with nurses for more information.  No family present at this time and no needs.     10/20/2021 0900  Clinical Encounter Type  Visited With Patient  Visit Type Code

## 2021-10-21 NOTE — Progress Notes (Signed)
ME notified of case as patient had fall prior to admission at home. Spoke to [employer] MD. Called back at 1242 and notified that patient is not a medical examiner case at this time.

## 2021-10-21 NOTE — Death Summary Note (Signed)
DEATH SUMMARY   Patient Details  Name: Bobby Fernandez MRN: [id number] DOB: 02/08/1960  Admission/Discharge Information   Admit Date:  Oct 13, 2021  Date of Death: Date of Death: 10/15/21  Time of Death: Time of Death: 1120  Length of Stay: 2  Referring Physician: [facility]   Reason(s) for Hospitalization  Rhabdomyolysis, renal failure, and Fall  Diagnoses  Preliminary cause of death:  Secondary Diagnoses (including complications and co-morbidities):  Principal Problem:   Septic shock (HCC) Active Problems:   DM2 (diabetes mellitus, type 2) (HCC)   AKI (acute kidney injury) (HCC)   Anemia   HFrEF (heart failure with reduced ejection fraction) (HCC)   Rhabdomyolysis   Elevated troponin   Elevated LFTs   Fall at home, initial encounter   HLD (hyperlipidemia)   Thrombocytopenia (HCC)   Metabolic acidosis   Respiratory distress   E coli bacteremia   E. coli urinary tract infection   DNR (do not resuscitate)   Comfort measures only status   Brief Hospital Course (including significant findings, care, treatment, and services provided and events leading to death)  Aasir Daigler is a 61 y.o. year old male who has significant past no history of diabetes, cardiomyopathy reduced EF, recent AKA who presented for fall and renal failure and rhabdomyolysis.  Had subsequent worsening of pulmonary/respiratory status in setting of severe metabolic acidosis due to renal failure and anion gap metabolic acidosis due to uremia.  He developed hypotension.  Remains on antibiotics the entirety of the time.  His blood cultures and urine cultures returned with E. coli.  Unfortunate, urine culture was resistant to cefepime and Zosyn which were used.  He switched to meropenem on the day of his death.  Unfortunate had a CODE BLUE event and PEA arrest likely due to severe metabolic derangements in setting of renal failure and severe septic shock.  He was resuscitated after  14 minutes of CPR and 5 doses of epinephrine and several doses of bicarb and calcium.  He was intubated during the code.  He required higher doses of vasopressors.  A central line was placed.  Family came to bedside.  I said he would never want this.  I produce advanced directive but said that if he has high likelihood of death and low likelihood of functional recovery he would not want to have life prolonging measures.  In setting of multisystem organ failure and PEA arrest is in hospital mortality is very high.  To share with him.  After shared decision making he was made comfort care.  He was terminally extubated.  He passed shortly thereafter surrounded by family.    Pertinent Labs and Studies  Significant Diagnostic Studies DG Abd 1 View  Result Date: Oct 15, 2021 CLINICAL DATA:  NG tube placement EXAM: ABDOMEN - 1 VIEW COMPARISON:  Abdominal radiograph dated 10/13/21 FINDINGS: NG tube tip and side port project over the distal stomach. No gas-filled dilated loops of bowel are seen in the upper abdomen. Heterogeneous opacities of the partially visualized right lung base. IMPRESSION: NG tube tip and side port project over the distal stomach. Electronically Signed   By: Allegra Lai M.D.   On: 2021/10/15 10:32   DG CHEST PORT 1 VIEW  Result Date: 10-15-2021 CLINICAL DATA:  Central line placement. EXAM: PORTABLE CHEST 1 VIEW COMPARISON:  Radiographs earlier same date and 09/25/2021. FINDINGS: 0909 hours. Interval intubation. Tip of the endotracheal tube is 1.9 cm above the carina. New left IJ central venous  catheter projects to the level of the mid SVC. The heart size and mediastinal contours are stable allowing for patient rotation to the right. External pacing leads are in place. There are progressive right greater than left perihilar pulmonary opacities which may reflect edema or aspiration. No pleural effusion or pneumothorax. Suspicion of new lateral rib fractures bilaterally which may  relate to interval cardiopulmonary resuscitation. IMPRESSION: 1. Satisfactory position of the endotracheal and left IJ central venous catheters. 2. Suspected new rib fractures bilaterally.  Question interval CPR. 3. Increasing right greater than left perihilar pulmonary opacities consistent with edema, aspiration or contusion. Electronically Signed   By: Carey BullocksWilliam  Veazey M.D.   On: 09/25/2021 09:48   DG CHEST PORT 1 VIEW  Result Date: 10/05/2021 CLINICAL DATA:  Acute respiratory distress. EXAM: PORTABLE CHEST 1 VIEW COMPARISON:  Chest radiograph 09/25/2021 FINDINGS: The cardiac silhouette is mildly enlarged. Lung volumes remain low with pulmonary vascular congestion and similar mild diffuse interstitial prominence. No segmental airspace consolidation, sizeable pleural effusion, or pneumothorax is identified. IMPRESSION: Cardiomegaly and low lung volumes with pulmonary vascular congestion. Electronically Signed   By: Sebastian AcheAllen  Grady M.D.   On: 10/04/2021 09:17   US EKG SITE RITE  Result Date: 10/16/2021 If Site Rite image not attached, placement could not be confirmed due to current cardiac rhythm.  ECHOCARDIOGRAM COMPLETE  Result Date: 09/25/2021    ECHOCARDIOGRAM REPORT   Patient Name:   Bobby OaksROBERT Fernandez Date of Exam: 09/25/2021 Medical Rec #:  [id number]      Height:       71.0 in Accession #:    [id number]     Weight:       167.8 lb Date of Birth:  02/04/1960      BSA:          1.957 m Patient Age:    61 years       BP:           113/62 mmHg Patient Gender: M              HR:           125 bpm. Exam Location:  Inpatient Procedure: 2D Echo, Cardiac Doppler and Color Doppler Indications:    Acute respiratory distress  History:        Patient has no prior history of Echocardiogram examinations.  Sonographer:    Gaynell FaceEllisa Machuca Referring Phys: [id number] SAIMA RIZWAN IMPRESSIONS  1. Left ventricular ejection fraction, by estimation, is 35 to 40%. The left ventricle has moderately decreased function. The left ventricle  demonstrates global hypokinesis. Indeterminate diastolic filling due to E-A fusion.  2. Right ventricular systolic function is normal. The right ventricular size is normal.  3. Left atrial size was severely dilated.  4. The mitral valve is normal in structure. Trivial mitral valve regurgitation. No evidence of mitral stenosis.  5. The aortic valve is tricuspid. There is mild calcification of the aortic valve. There is mild thickening of the aortic valve. Aortic valve regurgitation is not visualized. Aortic valve sclerosis is present, with no evidence of aortic valve stenosis.  6. Aortic dilatation noted. There is borderline dilatation of the aortic root, measuring 38 mm.  7. The inferior vena cava is dilated in size with >50% respiratory variability, suggesting right atrial pressure of 8 mmHg. FINDINGS  Left Ventricle: Left ventricular ejection fraction, by estimation, is 35 to 40%. The left ventricle has moderately decreased function. The left ventricle demonstrates global hypokinesis. The left ventricular internal cavity size was  normal in size. There is no left ventricular hypertrophy. Indeterminate diastolic filling due to E-A fusion. Right Ventricle: The right ventricular size is normal. No increase in right ventricular wall thickness. Right ventricular systolic function is normal. Left Atrium: Left atrial size was severely dilated. Right Atrium: Right atrial size was normal in size. Pericardium: Trivial pericardial effusion is present. The pericardial effusion is lateral to the left ventricle and posterior to the left ventricle. Mitral Valve: The mitral valve is normal in structure. Mild mitral annular calcification. Trivial mitral valve regurgitation, with centrally-directed jet. No evidence of mitral valve stenosis. Tricuspid Valve: The tricuspid valve is normal in structure. Tricuspid valve regurgitation is not demonstrated. Aortic Valve: The aortic valve is tricuspid. There is mild calcification of the  aortic valve. There is mild thickening of the aortic valve. Aortic valve regurgitation is not visualized. Aortic valve sclerosis is present, with no evidence of aortic valve stenosis. Aortic valve mean gradient measures 4.0 mmHg. Aortic valve peak gradient measures 7.0 mmHg. Aortic valve area, by VTI measures 3.03 cm. Pulmonic Valve: The pulmonic valve was normal in structure. Pulmonic valve regurgitation is not visualized. Aorta: Aortic dilatation noted. There is borderline dilatation of the aortic root, measuring 38 mm. Venous: The inferior vena cava is dilated in size with greater than 50% respiratory variability, suggesting right atrial pressure of 8 mmHg. IAS/Shunts: No atrial level shunt detected by color flow Doppler.  LEFT VENTRICLE PLAX 2D LVIDd:         4.90 cm LVIDs:         3.90 cm LV PW:         1.10 cm LV IVS:        1.10 cm LVOT diam:     2.20 cm LV SV:         48 LV SV Index:   24 LVOT Area:     3.80 cm  LV Volumes (MOD) LV vol d, MOD A2C: 193.0 ml LV vol d, MOD A4C: 187.0 ml LV vol s, MOD A2C: 112.0 ml LV vol s, MOD A4C: 98.7 ml LV SV MOD A2C:     81.0 ml LV SV MOD A4C:     187.0 ml LV SV MOD BP:      89.6 ml RIGHT VENTRICLE TAPSE (M-mode): 1.8 cm LEFT ATRIUM              Index        RIGHT ATRIUM           Index LA diam:        3.60 cm  1.84 cm/m   RA Area:     12.90 cm LA Vol (A2C):   103.0 ml 52.64 ml/m  RA Volume:   26.40 ml  13.49 ml/m LA Vol (A4C):   105.0 ml 53.66 ml/m LA Biplane Vol: 105.0 ml 53.66 ml/m  AORTIC VALVE AV Area (Vmax):    2.97 cm AV Area (Vmean):   2.97 cm AV Area (VTI):     3.03 cm AV Vmax:           132.00 cm/s AV Vmean:          89.200 cm/s AV VTI:            0.157 m AV Peak Grad:      7.0 mmHg AV Mean Grad:      4.0 mmHg LVOT Vmax:         103.00 cm/s LVOT Vmean:        69.800  cm/s LVOT VTI:          0.125 m LVOT/AV VTI ratio: 0.80  AORTA Ao Root diam: 3.80 cm MR Peak grad: 58.1 mmHg MR Vmax:      381.00 cm/s SHUNTS                           Systemic VTI:  0.12 m                            Systemic Diam: 2.20 cm Rachelle Hora Croitoru MD Electronically signed by Thurmon Fair MD Signature Date/Time: 09/25/2021/1:43:16 PM    Final    DG CHEST PORT 1 VIEW  Result Date: 09/25/2021 CLINICAL DATA:  Acute respiratory distress EXAM: PORTABLE CHEST 1 VIEW COMPARISON:  10/10/2021 FINDINGS: Low volume chest accentuating cardiomegaly. Diffuse interstitial coarsening. No visible effusion or Kerley lines. No air bronchogram. IMPRESSION: Cardiomegaly and vascular congestion accentuated by low volume chest. Electronically Signed   By: Tiburcio Pea M.D.   On: 09/25/2021 12:06   US Abdomen Complete  Result Date: 10-10-21 CLINICAL DATA:  Elevated LFTs.  Acute renal insufficiency. EXAM: ABDOMEN ULTRASOUND COMPLETE COMPARISON:  None Available. FINDINGS: Evaluation is limited due to overlying bowel gas and inability of the patient to cooperate with exam. Gallbladder: There are layering gallstones. No gallbladder wall thickening or pericholecystic fluid. Negative sonographic Murphy's sign. Common bile duct: Diameter: 4 mm Liver: No focal lesion identified. Within normal limits in parenchymal echogenicity. Portal vein is patent on color Doppler imaging with normal direction of blood flow towards the liver. IVC: No abnormality visualized. Pancreas: The pancreas is poorly visualized and obscured by bowel gas. Spleen: Top-normal size measuring up to 13 cm. Right Kidney: Length: 13.3 cm. Normal echogenicity. No hydronephrosis or shadowing stone. Left Kidney: Length: 13.0 cm. Normal echogenicity. No hydronephrosis or shadowing stone. A 1.7 cm upper pole cyst. Abdominal aorta: No aneurysm visualized. Other findings: None. IMPRESSION: 1. Cholelithiasis without sonographic evidence of acute cholecystitis. 2. Small left renal upper pole cyst. Electronically Signed   By: Elgie Collard M.D.   On: 2021/10/10 20:17   DG Femur Min 2 Views Left  Result Date: 10-10-2021 CLINICAL DATA:  Larey Seat last night.  Patient found on the floor this morning. EXAM: LEFT FEMUR 2 VIEWS COMPARISON:  None Available. FINDINGS: Previous above the knee amputation. Amputated margin of the mid femoral shaft is well-defined. No fracture.  No bone lesion. Hip joint normally spaced and aligned. Soft tissues are unremarkable. IMPRESSION: 1. No acute fracture.  No dislocation. Electronically Signed   By: Amie Portland M.D.   On: October 10, 2021 15:30   DG Pelvis Portable  Result Date: 2021/10/10 CLINICAL DATA:  Larey Seat last night. Patient on the floor since midnight, found this morning. EXAM: PORTABLE PELVIS 1-2 VIEWS COMPARISON:  Lumbar spine radiographs, 11/30/2019. FINDINGS: No fracture of the pelvis or proximal from [facility]. Old compression fractures of L3 and L4. No bone lesions. Hip joints, SI joints and symphysis pubis are normally spaced and aligned. Soft tissues are unremarkable. IMPRESSION: No acute fracture or dislocation. Electronically Signed   By: Amie Portland M.D.   On: 10/10/21 15:28   DG Chest Portable 1 View  Result Date: 10/10/21 CLINICAL DATA:  Fall last night. EXAM: PORTABLE CHEST 1 VIEW COMPARISON:  None Available. FINDINGS: The heart size and mediastinal contours are within normal limits. Both lungs are clear. The visualized skeletal structures are unremarkable. IMPRESSION: No  active disease. Electronically Signed   By: Danae Orleans M.D.   On: 2021/10/24 15:26   CT Head Wo Contrast  Result Date: 24-Oct-2021 CLINICAL DATA:  No status change of unknown cause. Found on the floor this morning. Vomiting yesterday. EXAM: CT HEAD WITHOUT CONTRAST CT CERVICAL SPINE WITHOUT CONTRAST TECHNIQUE: Multidetector CT imaging of the head and cervical spine was performed following the standard protocol without intravenous contrast. Multiplanar CT image reconstructions of the cervical spine were also generated. RADIATION DOSE REDUCTION: This exam was performed according to the departmental dose-optimization program which includes  automated exposure control, adjustment of the mA and/or kV according to patient size and/or use of iterative reconstruction technique. COMPARISON:  None Available. FINDINGS: CT HEAD FINDINGS Brain: No evidence of acute infarction, hemorrhage, hydrocephalus, extra-axial collection or mass lesion/mass effect. Vascular: No hyperdense vessel or unexpected calcification. Skull: Old right frontal burr hole.  No fracture.  No bone lesion. Sinuses/Orbits: Globes and orbits are unremarkable. Mild ethmoid sinus mucosal thickening. Other: None. CT CERVICAL SPINE FINDINGS Alignment: Normal. Skull base and vertebrae: No acute fracture. No primary bone lesion or focal pathologic process. Soft tissues and spinal canal: No prevertebral fluid or swelling. No visible canal hematoma. Disc levels: Moderate loss of disc height at C6-C7 with mild endplate sclerosis and spurring and mild disc bulging. Remaining cervical discs are well preserved in height. No disc herniation. No significant stenosis. Upper chest: Unremarkable. Other: None. IMPRESSION: HEAD CT 1. No acute intracranial abnormalities. CERVICAL CT 1. No fracture or acute finding. Electronically Signed   By: Amie Portland M.D.   On: 10/24/21 15:22   CT Cervical Spine Wo Contrast  Result Date: 10-24-21 CLINICAL DATA:  No status change of unknown cause. Found on the floor this morning. Vomiting yesterday. EXAM: CT HEAD WITHOUT CONTRAST CT CERVICAL SPINE WITHOUT CONTRAST TECHNIQUE: Multidetector CT imaging of the head and cervical spine was performed following the standard protocol without intravenous contrast. Multiplanar CT image reconstructions of the cervical spine were also generated. RADIATION DOSE REDUCTION: This exam was performed according to the departmental dose-optimization program which includes automated exposure control, adjustment of the mA and/or kV according to patient size and/or use of iterative reconstruction technique. COMPARISON:  None Available.  FINDINGS: CT HEAD FINDINGS Brain: No evidence of acute infarction, hemorrhage, hydrocephalus, extra-axial collection or mass lesion/mass effect. Vascular: No hyperdense vessel or unexpected calcification. Skull: Old right frontal burr hole.  No fracture.  No bone lesion. Sinuses/Orbits: Globes and orbits are unremarkable. Mild ethmoid sinus mucosal thickening. Other: None. CT CERVICAL SPINE FINDINGS Alignment: Normal. Skull base and vertebrae: No acute fracture. No primary bone lesion or focal pathologic process. Soft tissues and spinal canal: No prevertebral fluid or swelling. No visible canal hematoma. Disc levels: Moderate loss of disc height at C6-C7 with mild endplate sclerosis and spurring and mild disc bulging. Remaining cervical discs are well preserved in height. No disc herniation. No significant stenosis. Upper chest: Unremarkable. Other: None. IMPRESSION: HEAD CT 1. No acute intracranial abnormalities. CERVICAL CT 1. No fracture or acute finding. Electronically Signed   By: Amie Portland M.D.   On: 10-24-21 15:22    Microbiology Recent Results (from the past 240 hour(s))  Urine Culture     Status: Abnormal   Collection Time: 10/24/21  2:40 PM   Specimen: Urine, Clean Catch  Result Value Ref Range Status   Specimen Description   Final    URINE, CLEAN CATCH Performed at [facility], [address]  [place], [place] [address]    Special Requests   Final    NONE Performed at [facility], [address], [place], [place] [address]    Culture >=100,000 COLONIES/mL ESCHERICHIA COLI (A)  Final   Report Status 09/30/2021 FINAL  Final   Organism ID, Bacteria ESCHERICHIA COLI (A)  Final      Susceptibility   Escherichia coli - MIC*    AMPICILLIN >=32 RESISTANT Resistant     CEFAZOLIN >=64 RESISTANT Resistant     CEFEPIME <=0.12 SENSITIVE Sensitive     CEFTRIAXONE 32 RESISTANT Resistant     CIPROFLOXACIN <=0.25 SENSITIVE Sensitive     GENTAMICIN <=1  SENSITIVE Sensitive     IMIPENEM <=0.25 SENSITIVE Sensitive     NITROFURANTOIN 64 INTERMEDIATE Intermediate     TRIMETH/SULFA <=20 SENSITIVE Sensitive     AMPICILLIN/SULBACTAM >=32 RESISTANT Resistant     PIP/TAZO 16 SENSITIVE Sensitive     * >=100,000 COLONIES/mL ESCHERICHIA COLI  Resp Panel by RT-PCR (Flu A&B, Covid) Anterior Nasal Swab     Status: None   Collection Time: 15-Oct-2021  3:26 PM   Specimen: Anterior Nasal Swab  Result Value Ref Range Status   SARS Coronavirus 2 by RT PCR NEGATIVE NEGATIVE Final    Comment: (NOTE) SARS-CoV-2 target nucleic acids are NOT DETECTED.  The SARS-CoV-2 RNA is generally detectable in upper respiratory specimens during the acute phase of infection. The lowest concentration of SARS-CoV-2 viral copies this assay can detect is 138 copies/mL. A negative result does not preclude SARS-Cov-2 infection and should not be used as the sole basis for treatment or other patient management decisions. A negative result may occur with  improper specimen collection/handling, submission of specimen other than nasopharyngeal swab, presence of viral mutation(s) within the areas targeted by this assay, and inadequate number of viral copies(<138 copies/mL). A negative result must be combined with clinical observations, patient history, and epidemiological information. The expected result is Negative.  Fact Sheet for Patients:  BloggerCourse.com  Fact Sheet for Healthcare Providers:  SeriousBroker.it  This test is no t yet approved or cleared by the [place] FDA and  has been authorized for detection and/or diagnosis of SARS-CoV-2 by FDA under an Emergency Use Authorization (EUA). This EUA will remain  in effect (meaning this test can be used) for the duration of the COVID-19 declaration under Section 564(b)(1) of the Act, 21 U.S.C.section 360bbb-3(b)(1), unless the authorization is terminated  or revoked  sooner.       Influenza A by PCR NEGATIVE NEGATIVE Final   Influenza B by PCR NEGATIVE NEGATIVE Final    Comment: (NOTE) The Xpert Xpress SARS-CoV-2/FLU/RSV plus assay is intended as an aid in the diagnosis of influenza from Nasopharyngeal swab specimens and should not be used as a sole basis for treatment. Nasal washings and aspirates are unacceptable for Xpert Xpress SARS-CoV-2/FLU/RSV testing.  Fact Sheet for Patients: BloggerCourse.com  Fact Sheet for Healthcare Providers: SeriousBroker.it  This test is not yet approved or cleared by the [place] FDA and has been authorized for detection and/or diagnosis of SARS-CoV-2 by FDA under an Emergency Use Authorization (EUA). This EUA will remain in effect (meaning this test can be used) for the duration of the COVID-19 declaration under Section 564(b)(1) of the Act, 21 U.S.C. section 360bbb-3(b)(1), unless the authorization is terminated or revoked.  Performed at [facility], [address] [place], [place] [address]   MRSA Next Gen by PCR, Nasal     Status:  None   Collection Time: 09/25/21  8:54 AM   Specimen: Nasal Mucosa; Nasal Swab  Result Value Ref Range Status   MRSA by PCR Next Gen NOT DETECTED NOT DETECTED Final    Comment: (NOTE) The GeneXpert MRSA Assay (FDA approved for NASAL specimens only), is one component of a comprehensive MRSA colonization surveillance program. It is not intended to diagnose MRSA infection nor to guide or monitor treatment for MRSA infections. Test performance is not FDA approved in patients less than 52 years old. Performed at [facility], [address] [place], [place] [address]   Culture, blood (Routine X 2) w Reflex to ID Panel     Status: Abnormal (Preliminary result)   Collection Time: 09/25/21  9:44 AM   Specimen: BLOOD RIGHT HAND  Result Value Ref Range Status   Specimen Description    Final    BLOOD RIGHT HAND Performed at [facility], [address] [place], [place] [address]    Special Requests   Final    BOTTLES DRAWN AEROBIC ONLY Blood Culture adequate volume Performed at [facility], [address] [place], [place] [address]    Culture  Setup Time   Final    GRAM NEGATIVE RODS AEROBIC BOTTLE ONLY CRITICAL RESULT CALLED TO, READ BACK BY AND VERIFIED WITH: PHARMD ELLEN JACKSON ON 09/25/21 @ 2228 BY DRT    Culture (A)  Final    ESCHERICHIA COLI SUSCEPTIBILITIES TO FOLLOW Performed at [facility], [address], [place], [place] [address]    Report Status PENDING  Incomplete  Blood Culture ID Panel (Reflexed)     Status: Abnormal   Collection Time: 09/25/21  9:44 AM  Result Value Ref Range Status   Enterococcus faecalis NOT DETECTED NOT DETECTED Final   Enterococcus Faecium NOT DETECTED NOT DETECTED Final   Listeria monocytogenes NOT DETECTED NOT DETECTED Final   Staphylococcus species NOT DETECTED NOT DETECTED Final   Staphylococcus aureus (BCID) NOT DETECTED NOT DETECTED Final   Staphylococcus epidermidis NOT DETECTED NOT DETECTED Final   Staphylococcus lugdunensis NOT DETECTED NOT DETECTED Final   Streptococcus species NOT DETECTED NOT DETECTED Final   Streptococcus agalactiae NOT DETECTED NOT DETECTED Final   Streptococcus pneumoniae NOT DETECTED NOT DETECTED Final   Streptococcus pyogenes NOT DETECTED NOT DETECTED Final   A.calcoaceticus-baumannii NOT DETECTED NOT DETECTED Final   Bacteroides fragilis NOT DETECTED NOT DETECTED Final   Enterobacterales DETECTED (A) NOT DETECTED Final    Comment: Enterobacterales represent a large order of gram negative bacteria, not a single organism. CRITICAL RESULT CALLED TO, READ BACK BY AND VERIFIED WITH: PHARMD ELLEN JACKSON ON 09/25/21 @ 2228 BY DRT    Enterobacter cloacae complex NOT DETECTED NOT DETECTED Final   Escherichia coli DETECTED (A) NOT DETECTED Final     Comment: CRITICAL RESULT CALLED TO, READ BACK BY AND VERIFIED WITH: PHARMD ELLEN JACKSON ON 09/25/21 @ 2228 BY DRT    Klebsiella aerogenes NOT DETECTED NOT DETECTED Final   Klebsiella oxytoca NOT DETECTED NOT DETECTED Final   Klebsiella pneumoniae NOT DETECTED NOT DETECTED Final   Proteus species NOT DETECTED NOT DETECTED Final   Salmonella species NOT DETECTED NOT DETECTED Final   Serratia marcescens NOT DETECTED NOT DETECTED Final   Haemophilus influenzae NOT DETECTED NOT DETECTED Final   Neisseria meningitidis NOT DETECTED NOT DETECTED Final   Pseudomonas aeruginosa NOT DETECTED NOT DETECTED Final   Stenotrophomonas maltophilia NOT DETECTED NOT DETECTED Final   Candida  albicans NOT DETECTED NOT DETECTED Final   Candida auris NOT DETECTED NOT DETECTED Final   Candida glabrata NOT DETECTED NOT DETECTED Final   Candida krusei NOT DETECTED NOT DETECTED Final   Candida parapsilosis NOT DETECTED NOT DETECTED Final   Candida tropicalis NOT DETECTED NOT DETECTED Final   Cryptococcus neoformans/gattii NOT DETECTED NOT DETECTED Final   CTX-M ESBL NOT DETECTED NOT DETECTED Final   Carbapenem resistance IMP NOT DETECTED NOT DETECTED Final   Carbapenem resistance KPC NOT DETECTED NOT DETECTED Final   Carbapenem resistance NDM NOT DETECTED NOT DETECTED Final   Carbapenem resist OXA 48 LIKE NOT DETECTED NOT DETECTED Final   Carbapenem resistance VIM NOT DETECTED NOT DETECTED Final    Comment: Performed at [facility], [address] [place], [place] [address]  Culture, blood (Routine X 2) w Reflex to ID Panel     Status: Abnormal (Preliminary result)   Collection Time: 09/25/21 10:04 AM   Specimen: BLOOD RIGHT HAND  Result Value Ref Range Status   Specimen Description   Final    BLOOD RIGHT HAND Performed at [facility], [address] [place], [place] [address]    Special Requests   Final    BOTTLES DRAWN AEROBIC ONLY Blood Culture adequate volume Performed at  [facility], [address], [place], [place] [address]    Culture  Setup Time   Final    GRAM NEGATIVE RODS AEROBIC BOTTLE ONLY PHARMD ELLEN JACKSON ON 09/25/21 @ 2228 BY DRT Performed at [facility], [address] [place], [place] [address]    Culture ESCHERICHIA COLI (A)  Final   Report Status PENDING  Incomplete  Urine Culture     Status: Abnormal (Preliminary result)   Collection Time: 09/25/21  6:14 PM   Specimen: Urine, Catheterized  Result Value Ref Range Status   Specimen Description   Final    URINE, CATHETERIZED Performed at [facility], [address], [place], [place] [address]    Special Requests   Final    NONE Performed at [facility], [address] [place], [place] [address]    Culture (A)  Final    >=100,000 COLONIES/mL GRAM NEGATIVE RODS IDENTIFICATION AND SUSCEPTIBILITIES TO FOLLOW Performed at [facility], [address], [place], [place] [address]    Report Status PENDING  Incomplete    Lab Basic Metabolic Panel: Recent Labs  Lab 09/25/21 0112 09/25/21 1224 09/25/21 1701 10/13/2021 0731 10-13-21 1003  NA 129* 129* 131* 136 139  K 3.6 4.6 3.6 4.2 3.7  CL 103 104 103 105 103  CO2 13* 10* 13* 17* 18*  GLUCOSE 84 85 120* 80 131*  BUN 70* 77* 77* 86* 86*  CREATININE 4.04* 4.64* 4.41* 4.77* 4.66*  CALCIUM 7.1* 6.7* 6.7* 6.7* 7.1*  MG  --   --   --  1.7  --   PHOS  --   --   --  6.4*  --    Liver Function Tests: Recent Labs  Lab 10/12/2021 1438 09/25/21 0112 10/13/2021 0731  AST 99* 120* 76*  ALT 23 28 33  ALKPHOS 57 67 59  BILITOT 1.0 1.0 1.6*  PROT 6.2* 6.3* 5.1*  ALBUMIN 2.8* 2.9* 2.2*   Recent Labs  Lab 10/07/2021 1438  LIPASE 17   No results for input(s): "AMMONIA" in the last 168 hours. CBC: Recent Labs  Lab 10/08/2021 1438 10/20/2021 1911 09/25/21 0112 09/25/21  1327 09/25/21 1453 09/25/21 2258 October 01, 2021 1003  WBC 11.7*  --  8.6 9.7 11.8* 5.6 23.9*   NEUTROABS 10.3*  --   --   --   --   --   --   HGB 7.1*   < > 7.0* 5.8* 6.8* 8.9* 8.6*  HCT 21.6*   < > 21.4* 18.9* 21.7* 25.5* 26.2*  MCV 76.3*  --  76.7* 81.1 79.8* 74.6* 78.7*  PLT 117*  --  95* 65* 73* 48* 46*   < > = values in this interval not displayed.   Cardiac Enzymes: Recent Labs  Lab 09/23/2021 1438 09/25/21 0112 2021-10-01 0731  CKTOTAL 7,503* 4,436* 761*   Sepsis Labs: Recent Labs  Lab 10/17/2021 1438 09/25/21 0112 09/25/21 1327 09/25/21 1453 09/25/21 2258 10-01-2021 1003  WBC 11.7*   < > 9.7 11.8* 5.6 23.9*  LATICACIDVEN 1.4  --   --   --   --  3.7*   < > = values in this interval not displayed.    Procedures/Operations  As per EMR   Lesia Sago Violet Seabury 2021/10/01, 8:26 PM

## 2021-10-21 NOTE — Progress Notes (Signed)
NAME:  Bobby Fernandez, MRN:  [id number], DOB:  30-Jun-1960, LOS: 2 ADMISSION DATE:  10/19/21, CONSULTATION DATE: 9/5 REFERRING MD: Dr. 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  He presented to [facility] emergency department on 9/4 with complaints of vomiting and fall for the preceding 24 hours.  After fall he was down for 13 hours until found by his brother.  Laboratory evaluation in the emergency department significant for serum creatinine 3.9, bicarb 16, BUN 66, AST 99, GFR 16.  As well as hemoglobin 7.1, platelets 117, WBC 11.7.  CK elevated at 7500.  He was admitted to the hospitalist service for treatment of acute kidney injury secondary to rhabdomyolysis as well as symptomatic anemia secondary to suspected GI bleed.  Hemoccult positive.  Despite treatment with IV fluids, on 5/5 he became tachycardic, hypotensive, and tachypneic.  He was transferred to the ICU for closer monitoring. Shortly after ICU transfer he was started on BiPAP and became less responsive. Systolic blood pressures dipped into the 80s. PCCM consulted.   Pertinent  Medical History   has no past medical history on file. Hypertension, hyperlipidemia, DM 2, left AKA, HFrEF.   Significant Hospital Events: Including procedures, antibiotic start and stop dates in addition to other pertinent events   9/4 admitted for AKI, rhabdo following a fall at home.  9/5 tx to ICU. Started on BiPAP, Pressors.   Interim History / Subjective:  Remains on BiPAP. UOP low at 150 overnight, 300 total past 24 hours. SCr up (4.41 > 4.77); however, HCO3 and CK gradually improving.  Objective   Blood pressure (!) 89/39, pulse (!) 125, temperature 97.8 F  (36.6 C), temperature source Axillary, resp. rate (!) 39, height 5\' 11"  (1.803 m), weight 76.1 kg, SpO2 95 %.    FiO2 (%):  [30 %-32 %] 32 %   Intake/Output Summary (Last 24 hours) at 09/22/2021 0806 Last data filed at 10/09/2021 0600 Gross per 24 hour  Intake 4041.54 ml  Output 500 ml  Net 3541.54 ml    Filed Weights   10-19-2021 2023 09/25/21 0500 09/25/21 0900  Weight: 75.2 kg 75.1 kg 76.1 kg    Examination: General: thin adult male, in NAD HENT: [place]/AT, PERRL, no JVD. BiPAP in place Lungs: Clear bilateral breath sounds on BiPAP Cardiovascular: Tachy, regular, no MRG Abdomen: Soft, non-tender, non-distended Extremities: L AKA. No acute deformity Neuro: Somnolent, will arouse to voice and respond appropriately.  GU: Condom cath   Assessment & Plan:   AKI secondary to rhabdomyolysis and hypovolemia - AKI worse but HCO3 CK starting to improve. AG + NAGMA - 2/2 above. - Will place CVL and follow CVP's to help guide fluid resuscitation. - Continue HCO3 infusion. - Nephrology following, appreciate the assistance. - Follow BMP.  Shock - combo of septic from E.coli bacteremia as well as hypovolemic.  Hx sCHF - Echo with EF 35-40% (worse compared to Feb 2023 with EF 50%). - Continue fluids. - Continue NE for goal MAP > 65. - Continue Ceftriaxone. - Holding home coreg, lisniopril, furosemide. - F/u with cardiology as outpatient.  Tachypnea / mildly increased WOB - felt to be 2/2 metabolic acidosis, tolerating well at this time. - Continue BiPAP for increased WOB, wean to off as able.  Symptomatic  anemia: Occult blood positive but no obvious bleeding, Hgb improved after 2u PRBC 9/5. - Continue Protonix BID. - Trend H&H. - GI consulted 9/5.  DM2. - CBG monitoring and SSI. - Hold home [place], metformin.  Fall: trauma eval negative. - PT when appropriate.  LFT elevation: RUQ ultrasound with cholelithiasis, no cholecystitis - Trend LFT. - Hold statin.   Best Practice  (right click and "Reselect all SmartList Selections" daily)   Diet/type: Regular consistency (see orders) DVT prophylaxis: SCD GI prophylaxis: PPI Lines: N/A Foley:  Yes, and it is still needed Code Status:  full code Last date of multidisciplinary goals of care discussion [ ]   Critical care time: 30 minutes.    , PA - [facility] For pager details, please see AMION or use Epic chat  After 1900, please call [facility] for cross coverage needs 2021-10-19, 8:31 AM

## 2021-10-21 NOTE — Procedures (Signed)
Central Venous Catheter Insertion Procedure Note  Bobby Fernandez  [id number]  [birth year]  Date:10/07/2021  Time:9:28 AM   Provider Performing:Tierany Appleby Celine Mans   Procedure: Insertion of Non-tunneled Central Venous Catheter(36556) with US guidance (46286)   Indication(s) Medication administration and Difficult access  Consent Unable to obtain consent due to emergent nature of procedure.  Anesthesia Topical only with 1% lidocaine   Timeout Verified patient identification, verified procedure, site/side was marked, verified correct patient position, special equipment/implants available, medications/allergies/relevant history reviewed, required imaging and test results available.  Sterile Technique Maximal sterile technique including full sterile barrier drape, hand hygiene, sterile gown, sterile gloves, mask, hair covering, sterile ultrasound probe cover (if used).  Procedure Description Area of catheter insertion was cleaned with chlorhexidine and draped in sterile fashion.  With real-time ultrasound guidance a central venous catheter was placed into the left internal jugular vein. Nonpulsatile blood flow and easy flushing noted in all ports.  The catheter was sutured in place and sterile dressing applied.  Complications/Tolerance None; patient tolerated the procedure well. Chest X-ray is ordered to verify placement for internal jugular or subclavian cannulation.   Chest x-ray is not ordered for femoral cannulation.  EBL Minimal  Specimen(s) None   Rutherford Guys, PA - C  Pulmonary & Critical Care Medicine For pager details, please see AMION or use Epic chat  After 1900, please call [facility] for cross coverage needs 10/18/2021, 9:29 AM

## 2021-10-21 NOTE — Procedures (Signed)
Extubation Procedure Note  Patient Details:   Name: Rickey Sadowski DOB: 1960-07-12 MRN: [id number]   Airway Documentation:  Airway 8 mm (Active)  Secured at (cm) 26 cm 10/11/2021 0912  Measured From Lips 11-Oct-2021 0912  Secured Location Center October 11, 2021 0912  Secured By [employer] 2021/10/11 0912  Prone position No 2021-10-11 0912  Head position Left 10-11-2021 0912  Cuff Pressure (cm H2O) Clear OR 27-39 CmH2O 10-11-2021 0912  Site Condition Cool;Dry Oct 11, 2021 0912   Vent end date: (not recorded) Vent end time: (not recorded)   Evaluation  O2 sats:  comfort care Complications: Complications of comfort care Patient did not tolerate procedure well. Bilateral Breath Sounds: Diminished   No Patient was extubated to comfort care.   Earnstine Regal 10/11/2021, 11:15 AM

## 2021-10-21 NOTE — Progress Notes (Signed)
RN went into room to assess patient who was removing BiPap, noticed IV watch screen blinking with a red alert. When assessing site noticed area around  IV was swollen and bruised. IV fluids paused to switch to alternate site and IV catheter removed. Heat packs were applied and patient's arm elevated on pillows. [facility] physician notified of infiltration and phentolamine protocol ordered. Phentolamine injections were given at extravastion site, Nitroglycerin paste was held due to low BP. Will continue to monitor site, and report changes to providers as necessary.

## 2021-10-21 NOTE — Procedures (Signed)
Intubation Procedure Note  Bobby Fernandez  [id number]  07/21/1960  Date:10/09/2021  Time:8:24 PM   Provider Performing:Glendia Olshefski R Madina Galati    Procedure: Intubation (31500)  Indication(s) Respiratory Failure  Consent Unable to obtain consent due to emergent nature of procedure.   Anesthesia None, performed during code blue   Time Out Verified patient identification, verified procedure, site/side was marked, verified correct patient position, special equipment/implants available, medications/allergies/relevant history reviewed, required imaging and test results available.   Sterile Technique Usual hand hygeine, masks, and gloves were used   Procedure Description Patient positioned in bed supine.  Sedation given as noted above.  Patient was intubated with endotracheal tube using Glidescope.  View was Grade 2 only posterior commissure .  Number of attempts was 1.  Colorimetric CO2 detector was consistent with tracheal placement.   Complications/Tolerance None; patient tolerated the procedure well. Chest X-ray is ordered to verify placement.   EBL none   Specimen(s) None

## 2021-10-21 DEATH — deceased

## 2022-07-17 IMAGING — CT CT EXTREM LOW W/ CM*L*
2 of 3 series · 11 of 46 positions shown, 12 images · IV contrast (agent unspecified)
Comparison: Plain films of the left foot earlier today.

CONTRAST:  100 mL OMNIPAQUE IOHEXOL 300 MG/ML  SOLN

CLINICAL DATA: Left lower leg pain and swelling for 1 week. Skin
wound on the left third and fourth toes.

EXAM:
CT OF THE LOWER LEFT EXTREMITY WITH CONTRAST
TECHNIQUE: Multidetector CT imaging of the lower left extremity was performed
according to the standard protocol following intravenous contrast
administration.

[Series 4: axial st · axial · 0.45mm/px · z∈[-764,-196]mm · 8 of 326 slices shown, 9 images]
[im 21/326  soft-tissue]
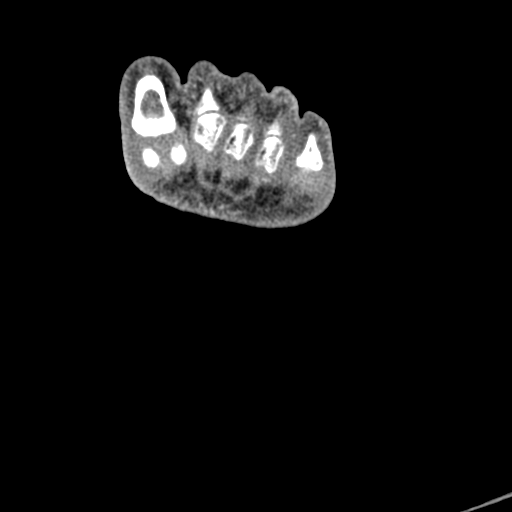
[im 21/326  bone]
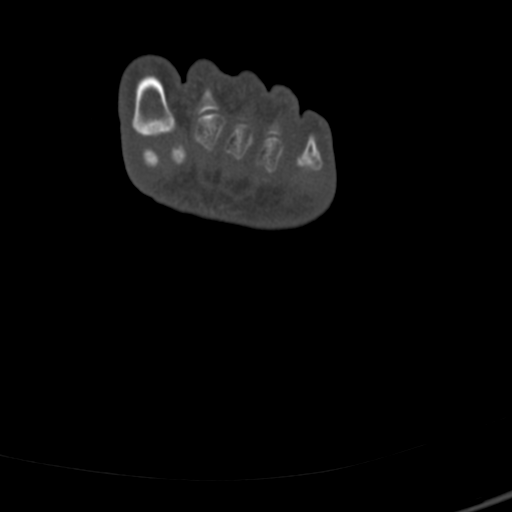
[im 63/326  soft-tissue]
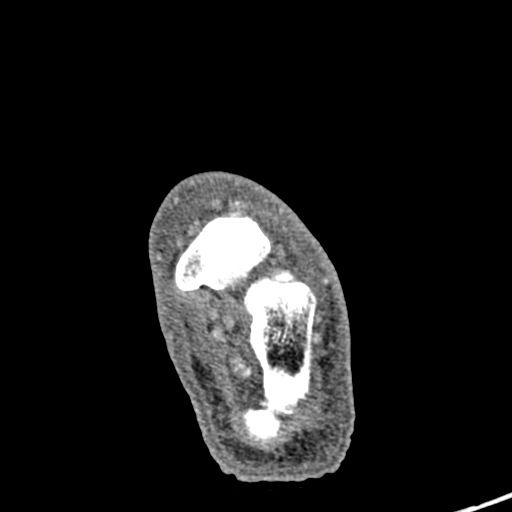
[im 105/326  soft-tissue]
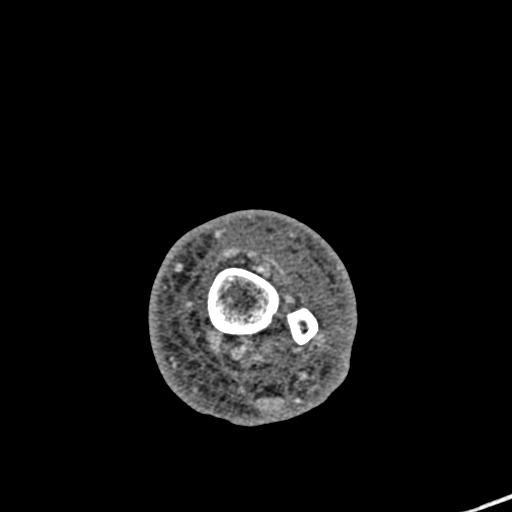
[im 147/326  soft-tissue]
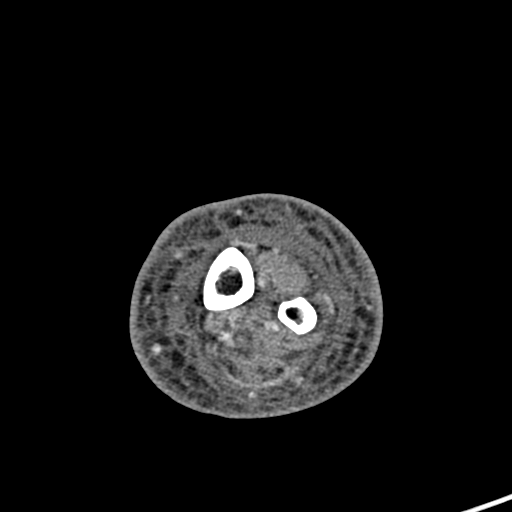
[im 179/326  soft-tissue]
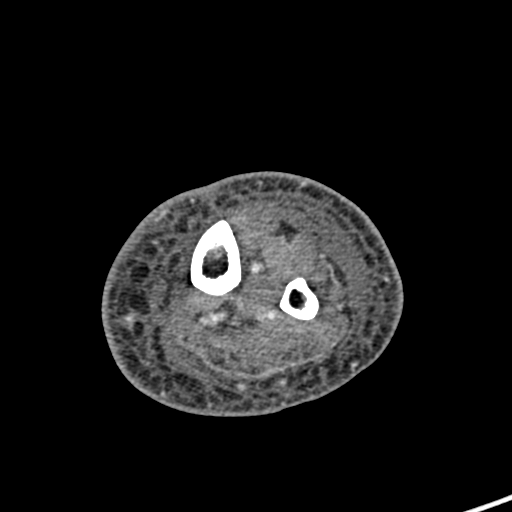
[im 221/326  soft-tissue]
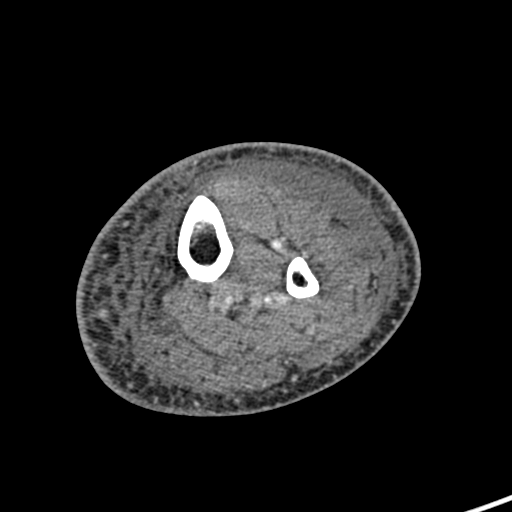
[im 263/326  soft-tissue]
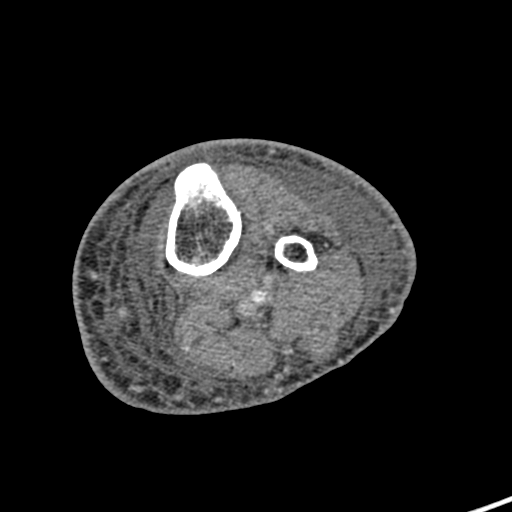
[im 305/326  soft-tissue]
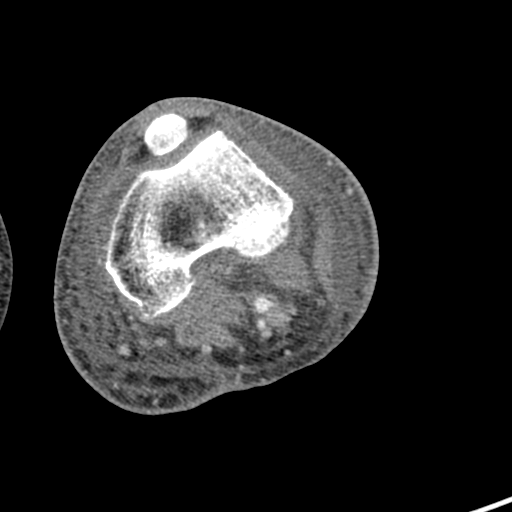

[Series 9: coronal st · coronal · 0.42mm/px · 3 of 119 slices shown]
[im 40/119  soft-tissue]
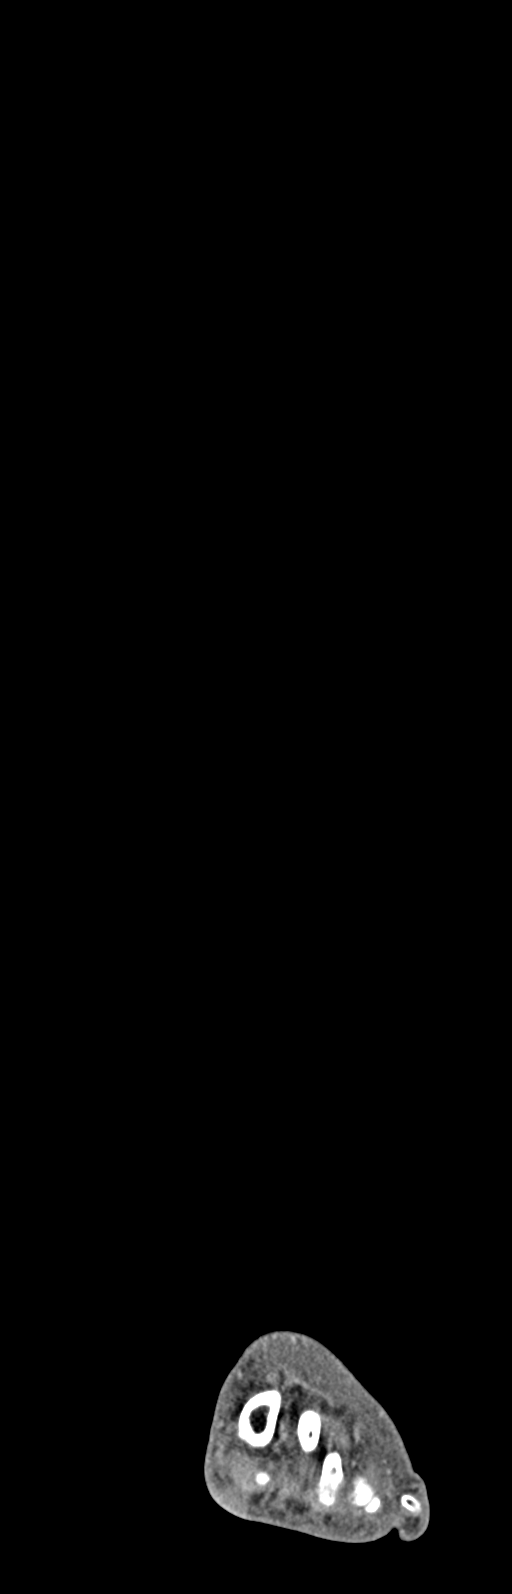
[im 53/119  soft-tissue]
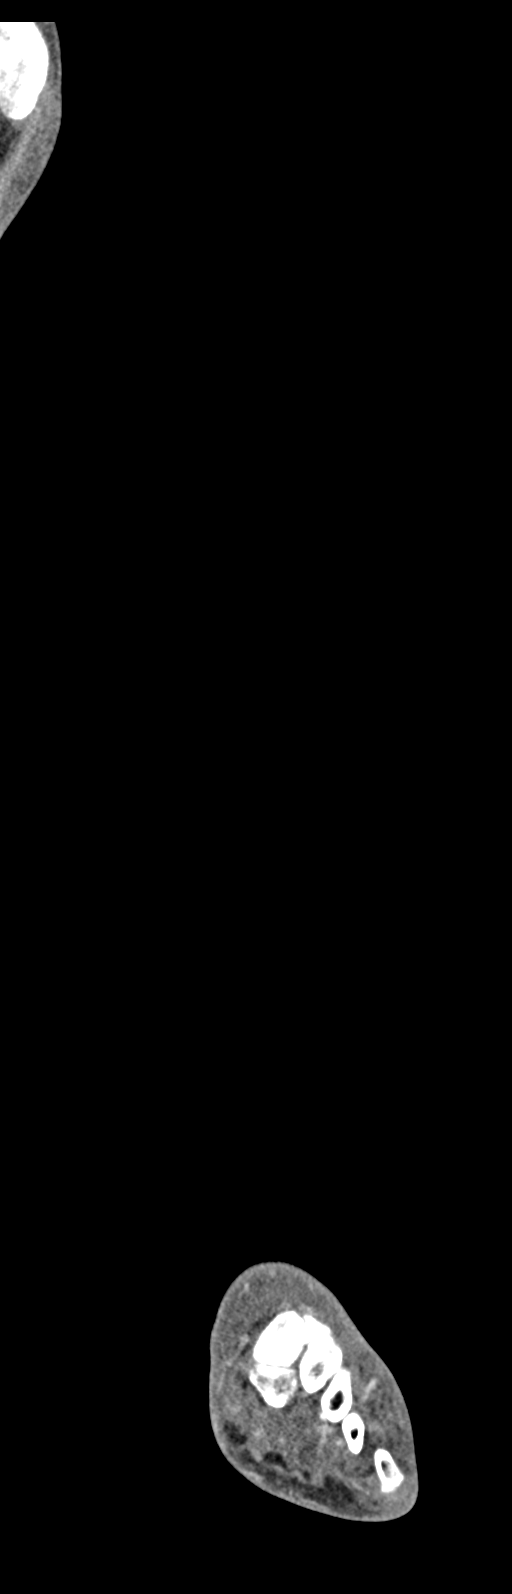
[im 66/119  soft-tissue]
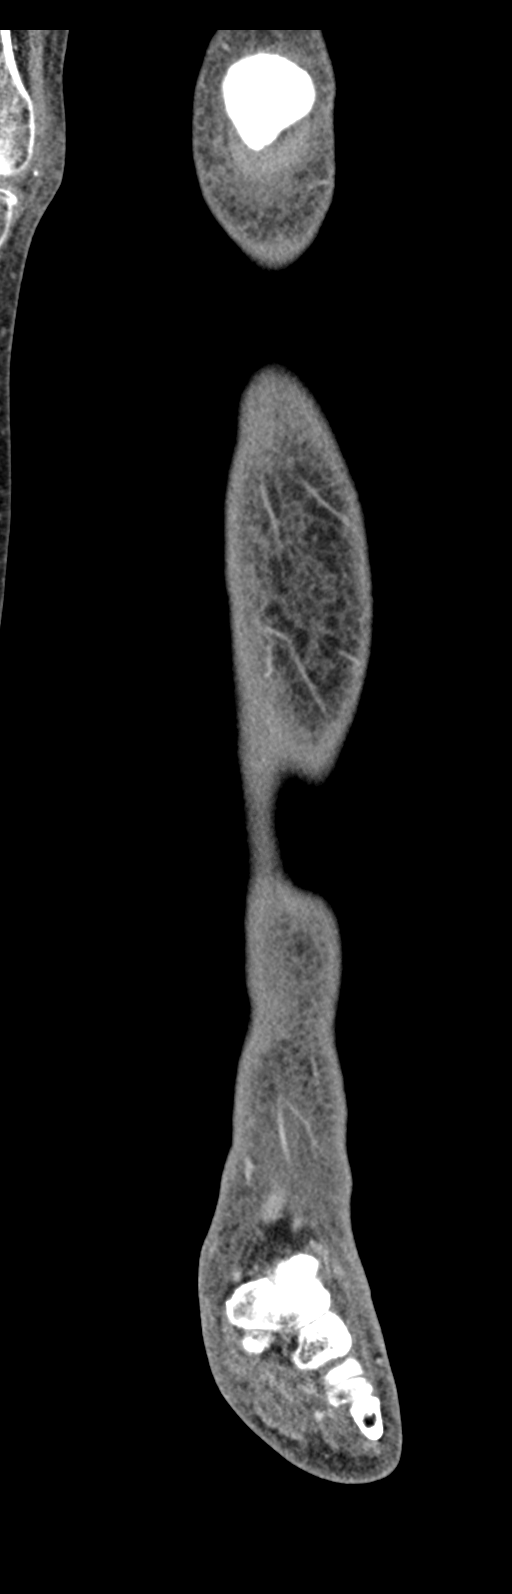

[11 of 46 positions shown; findings below may reference images not displayed]

FINDINGS: Bones/Joint/Cartilage

No acute or focal bony or joint abnormality is identified. There is
some osteophytosis about the knee.

Ligaments

Suboptimally assessed by CT.

Muscles and Tendons

No intramuscular fluid collection is seen. No gas within muscle or
tracking along fascial planes. There is mild fatty atrophy of all
imaged muscle bellies.

Soft tissues

Reported skin wounds on the third and fourth toes are not visible on
this study. Diffuse subcutaneous edema is present. No soft tissue
gas or radiopaque foreign body. No focal fluid collection.
IMPRESSION: Diffuse subcutaneous edema consistent with dependent change or
cellulitis. The exam is otherwise negative.
